# Patient Record
Sex: Female | Born: 1960 | Race: Black or African American | Hispanic: No | Marital: Single | Smoking: Former smoker
Health system: Southern US, Community
[De-identification: ages and names within clinical notes are randomized; demographics above are authoritative.]

## PROBLEM LIST (undated history)

## (undated) DIAGNOSIS — M1990 Unspecified osteoarthritis, unspecified site: Secondary | ICD-10-CM

## (undated) DIAGNOSIS — Z9289 Personal history of other medical treatment: Secondary | ICD-10-CM

## (undated) DIAGNOSIS — F319 Bipolar disorder, unspecified: Secondary | ICD-10-CM

## (undated) DIAGNOSIS — D649 Anemia, unspecified: Secondary | ICD-10-CM

## (undated) DIAGNOSIS — J449 Chronic obstructive pulmonary disease, unspecified: Secondary | ICD-10-CM

## (undated) DIAGNOSIS — K219 Gastro-esophageal reflux disease without esophagitis: Secondary | ICD-10-CM

## (undated) DIAGNOSIS — I10 Essential (primary) hypertension: Secondary | ICD-10-CM

## (undated) DIAGNOSIS — G8929 Other chronic pain: Secondary | ICD-10-CM

## (undated) DIAGNOSIS — Z9989 Dependence on other enabling machines and devices: Secondary | ICD-10-CM

## (undated) DIAGNOSIS — F329 Major depressive disorder, single episode, unspecified: Secondary | ICD-10-CM

## (undated) DIAGNOSIS — G43909 Migraine, unspecified, not intractable, without status migrainosus: Secondary | ICD-10-CM

## (undated) DIAGNOSIS — M545 Low back pain, unspecified: Secondary | ICD-10-CM

## (undated) DIAGNOSIS — E669 Obesity, unspecified: Secondary | ICD-10-CM

## (undated) DIAGNOSIS — F32A Depression, unspecified: Secondary | ICD-10-CM

## (undated) DIAGNOSIS — Z8711 Personal history of peptic ulcer disease: Secondary | ICD-10-CM

## (undated) DIAGNOSIS — J189 Pneumonia, unspecified organism: Secondary | ICD-10-CM

## (undated) DIAGNOSIS — M25559 Pain in unspecified hip: Secondary | ICD-10-CM

## (undated) DIAGNOSIS — R51 Headache: Secondary | ICD-10-CM

## (undated) DIAGNOSIS — I2699 Other pulmonary embolism without acute cor pulmonale: Secondary | ICD-10-CM

## (undated) DIAGNOSIS — G4733 Obstructive sleep apnea (adult) (pediatric): Secondary | ICD-10-CM

## (undated) DIAGNOSIS — J45909 Unspecified asthma, uncomplicated: Secondary | ICD-10-CM

## (undated) DIAGNOSIS — Z8719 Personal history of other diseases of the digestive system: Secondary | ICD-10-CM

## (undated) DIAGNOSIS — E119 Type 2 diabetes mellitus without complications: Secondary | ICD-10-CM

## (undated) HISTORY — PX: CARPAL TUNNEL RELEASE: SHX101

## (undated) HISTORY — PX: UMBILICAL HERNIA REPAIR: SHX196

## (undated) HISTORY — PX: HERNIA REPAIR: SHX51

## (undated) HISTORY — PX: ABDOMINAL HERNIA REPAIR: SHX539

---
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
## 1984-10-23 HISTORY — PX: DILATION AND CURETTAGE OF UTERUS: SHX78

## 1986-10-23 HISTORY — PX: TUBAL LIGATION: SHX77

## 1991-10-24 HISTORY — PX: TONSILLECTOMY: SUR1361

## 1998-10-27 ENCOUNTER — Emergency Department (HOSPITAL_COMMUNITY): Admission: EM | Admit: 1998-10-27 | Discharge: 1998-10-28 | Payer: Self-pay

## 1999-05-03 ENCOUNTER — Emergency Department (HOSPITAL_COMMUNITY): Admission: EM | Admit: 1999-05-03 | Discharge: 1999-05-03 | Payer: Self-pay | Admitting: Emergency Medicine

## 1999-05-03 ENCOUNTER — Encounter: Payer: Self-pay | Admitting: Emergency Medicine

## 1999-05-06 ENCOUNTER — Emergency Department (HOSPITAL_COMMUNITY): Admission: EM | Admit: 1999-05-06 | Discharge: 1999-05-06 | Payer: Self-pay | Admitting: Emergency Medicine

## 1999-06-23 ENCOUNTER — Emergency Department (HOSPITAL_COMMUNITY): Admission: EM | Admit: 1999-06-23 | Discharge: 1999-06-23 | Payer: Self-pay | Admitting: Emergency Medicine

## 2002-06-29 ENCOUNTER — Emergency Department (HOSPITAL_COMMUNITY): Admission: EM | Admit: 2002-06-29 | Discharge: 2002-06-29 | Payer: Self-pay | Admitting: Emergency Medicine

## 2002-06-29 ENCOUNTER — Encounter: Payer: Self-pay | Admitting: Emergency Medicine

## 2003-10-24 DIAGNOSIS — Z9289 Personal history of other medical treatment: Secondary | ICD-10-CM

## 2003-10-24 HISTORY — PX: UTERINE FIBROID EMBOLIZATION: SHX825

## 2003-10-24 HISTORY — DX: Personal history of other medical treatment: Z92.89

## 2009-10-23 HISTORY — PX: REDUCTION MAMMAPLASTY: SUR839

## 2013-01-14 ENCOUNTER — Encounter (HOSPITAL_COMMUNITY): Payer: Self-pay | Admitting: Emergency Medicine

## 2013-01-14 ENCOUNTER — Emergency Department (HOSPITAL_COMMUNITY)
Admission: EM | Admit: 2013-01-14 | Discharge: 2013-01-14 | Disposition: A | Discharge status: Home / Self Care | Payer: Medicare Other | Attending: Emergency Medicine | Admitting: Emergency Medicine

## 2013-01-14 DIAGNOSIS — J45909 Unspecified asthma, uncomplicated: Secondary | ICD-10-CM | POA: Insufficient documentation

## 2013-01-14 DIAGNOSIS — K029 Dental caries, unspecified: Secondary | ICD-10-CM

## 2013-01-14 DIAGNOSIS — I10 Essential (primary) hypertension: Secondary | ICD-10-CM | POA: Insufficient documentation

## 2013-01-14 DIAGNOSIS — E119 Type 2 diabetes mellitus without complications: Secondary | ICD-10-CM | POA: Insufficient documentation

## 2013-01-14 DIAGNOSIS — Z79899 Other long term (current) drug therapy: Secondary | ICD-10-CM | POA: Insufficient documentation

## 2013-01-14 DIAGNOSIS — E669 Obesity, unspecified: Secondary | ICD-10-CM | POA: Insufficient documentation

## 2013-01-14 HISTORY — DX: Unspecified asthma, uncomplicated: J45.909

## 2013-01-14 HISTORY — DX: Obesity, unspecified: E66.9

## 2013-01-14 HISTORY — DX: Essential (primary) hypertension: I10

## 2013-01-14 MED ORDER — HYDROCODONE-ACETAMINOPHEN 5-325 MG PO TABS
1.0000 | ORAL_TABLET | Freq: Once | ORAL | Status: DC
  Filled 2013-01-14: qty 1

## 2013-01-14 MED ORDER — HYDROCODONE-ACETAMINOPHEN 5-325 MG PO TABS: 2.0000 | ORAL_TABLET | Freq: Four times a day (QID) | ORAL | Status: DC | PRN

## 2013-01-14 MED ORDER — OXYCODONE-ACETAMINOPHEN 5-325 MG PO TABS
1.0000 | ORAL_TABLET | Freq: Once | ORAL | Status: AC
  Administered 2013-01-14: 1 via ORAL
  Filled 2013-01-14: qty 1

## 2013-01-14 MED ORDER — OXYCODONE-ACETAMINOPHEN 5-325 MG PO TABS: 1.0000 | ORAL_TABLET | Freq: Four times a day (QID) | ORAL | Status: DC | PRN

## 2013-01-14 NOTE — ED Notes (Signed)
PT. REPORTS RIGHT UPPER MOLAR PAIN ONSET YESTERDAY UNRELIEVED BY OTC PAIN MEDICATIONS.

## 2013-01-14 NOTE — ED Provider Notes (Signed)
Medical screening examination/treatment/procedure(s) were performed by non-physician practitioner and as supervising physician I was immediately available for consultation/collaboration.   Edinson Domeier L Mekiah Wahler, MD 01/14/13 0305 

## 2013-01-14 NOTE — ED Provider Notes (Signed)
History     CSN: [id number]  Arrival date & time 01/14/13  0010   First MD Initiated Contact with Patient 01/14/13 0100      Chief Complaint  Patient presents with  . Dental Pain    (Consider location/radiation/quality/duration/timing/severity/associated sxs/prior treatment) HPI Comments: Cavity in upper first molar for several days has ben unable to but any OTC meds due to finances   Patient is a 52 y.o. female presenting with tooth pain. The history is provided by the patient. The history is limited by a language barrier.  Dental PainThe primary symptoms include mouth pain. Primary symptoms do not include dental injury, headaches or fever. The symptoms began 2 days ago. The symptoms are worsening. The symptoms occur constantly.  Additional symptoms do not include: gum swelling, gum tenderness, trouble swallowing and ear pain.    Past Medical History  Diagnosis Date  . Obesity   . Diabetes mellitus without complication   . Hypertension   . Asthma     Past Surgical History  Procedure Laterality Date  . Breast reduction surgery    . Hernia repair    . Tubal ligation    . Ankle arthroscopy      No family history on file.  History  Substance Use Topics  . Smoking status: Never Smoker   . Smokeless tobacco: Not on file  . Alcohol Use: No    OB History   Grav Para Term Preterm Abortions TAB SAB Ect Mult Living                  Review of Systems  Constitutional: Negative for fever and chills.  HENT: Positive for dental problem. Negative for ear pain and trouble swallowing.   Neurological: Negative for headaches.  All other systems reviewed and are negative.    Allergies  Coconut oil and Mushroom extract complex  Home Medications   Current Outpatient Rx  Name  Route  Sig  Dispense  Refill  . gabapentin (NEURONTIN) 300 MG capsule   Oral   Take 300 mg by mouth 3 (three) times daily.         . hydrochlorothiazide (HYDRODIURIL) 25 MG tablet   Oral  Take 25 mg by mouth daily.         . meloxicam (MOBIC) 15 MG tablet   Oral   Take 15 mg by mouth daily.         Marland Kitchen tolterodine (DETROL LA) 4 MG 24 hr capsule   Oral   Take 4 mg by mouth daily.         Marland Kitchen HYDROcodone-acetaminophen (NORCO/VICODIN) 5-325 MG per tablet   Oral   Take 2 tablets by mouth every 6 (six) hours as needed for pain.   30 tablet   0   . oxyCODONE-acetaminophen (PERCOCET/ROXICET) 5-325 MG per tablet   Oral   Take 1 tablet by mouth every 6 (six) hours as needed for pain.   9 tablet   0     BP 148/84  Pulse 88  Temp(Src) 97.3 F (36.3 C) (Oral)  Resp 16  SpO2 95%  Physical Exam  Constitutional: She appears well-nourished.  HENT:  Head: Normocephalic.  Mouth/Throat:    Eyes: Pupils are equal, round, and reactive to light.  Neck: Normal range of motion.  Cardiovascular: Normal rate.   Pulmonary/Chest: Effort normal.  Musculoskeletal: Normal range of motion.  Neurological: She is alert.  Skin: Skin is warm and dry.    ED Course  Dental Date/Time:  01/14/2013 2:14 AM Performed by: Arman Filter Authorized by: Arman Filter Consent: Verbal consent obtained. Risks and benefits: risks, benefits and alternatives were discussed Consent given by: patient Patient identity confirmed: verbally with patient Time out: Immediately prior to procedure a "time out" was called to verify the correct patient, procedure, equipment, support staff and site/side marked as required. Local anesthesia used: yes Anesthesia: nerve block Local anesthetic: bupivacaine 0.5% without epinephrine Anesthetic total: 1.5 ml Patient sedated: no Patient tolerance: Patient tolerated the procedure well with no immediate complications. Comments: Good coverage and pain relief   (including critical care time)  Labs Reviewed - No data to display No results found.   1. Dental cavity       MDM  Dental block dental referral an Percocet Rx         Arman Filter,  NP 01/14/13 0216  Arman Filter, NP 01/14/13 4098

## 2013-05-28 ENCOUNTER — Other Ambulatory Visit: Payer: Self-pay | Admitting: Obstetrics and Gynecology

## 2013-05-28 DIAGNOSIS — Z1231 Encounter for screening mammogram for malignant neoplasm of breast: Secondary | ICD-10-CM

## 2013-06-13 ENCOUNTER — Ambulatory Visit: Payer: Medicare Other

## 2013-06-24 ENCOUNTER — Ambulatory Visit
Admission: RE | Admit: 2013-06-24 | Discharge: 2013-06-24 | Disposition: A | Discharge status: Home / Self Care | Payer: Medicare Other | Source: Ambulatory Visit | Attending: Obstetrics and Gynecology | Admitting: Obstetrics and Gynecology

## 2013-06-24 DIAGNOSIS — Z1231 Encounter for screening mammogram for malignant neoplasm of breast: Secondary | ICD-10-CM

## 2013-06-25 ENCOUNTER — Other Ambulatory Visit: Payer: Self-pay | Admitting: Obstetrics and Gynecology

## 2013-06-25 DIAGNOSIS — N644 Mastodynia: Secondary | ICD-10-CM

## 2013-06-25 DIAGNOSIS — N63 Unspecified lump in unspecified breast: Secondary | ICD-10-CM

## 2013-08-22 ENCOUNTER — Emergency Department (HOSPITAL_COMMUNITY): Payer: Medicare Other

## 2013-08-22 ENCOUNTER — Emergency Department (HOSPITAL_COMMUNITY)
Admission: EM | Admit: 2013-08-22 | Discharge: 2013-08-22 | Disposition: A | Discharge status: Home / Self Care | Payer: Medicare Other | Attending: Emergency Medicine | Admitting: Emergency Medicine

## 2013-08-22 ENCOUNTER — Encounter (HOSPITAL_COMMUNITY): Payer: Self-pay | Admitting: Emergency Medicine

## 2013-08-22 DIAGNOSIS — M7989 Other specified soft tissue disorders: Secondary | ICD-10-CM | POA: Insufficient documentation

## 2013-08-22 DIAGNOSIS — I10 Essential (primary) hypertension: Secondary | ICD-10-CM | POA: Insufficient documentation

## 2013-08-22 DIAGNOSIS — J329 Chronic sinusitis, unspecified: Secondary | ICD-10-CM | POA: Insufficient documentation

## 2013-08-22 DIAGNOSIS — Z794 Long term (current) use of insulin: Secondary | ICD-10-CM | POA: Insufficient documentation

## 2013-08-22 DIAGNOSIS — E119 Type 2 diabetes mellitus without complications: Secondary | ICD-10-CM | POA: Insufficient documentation

## 2013-08-22 DIAGNOSIS — Z791 Long term (current) use of non-steroidal anti-inflammatories (NSAID): Secondary | ICD-10-CM | POA: Insufficient documentation

## 2013-08-22 DIAGNOSIS — R609 Edema, unspecified: Secondary | ICD-10-CM | POA: Insufficient documentation

## 2013-08-22 DIAGNOSIS — E669 Obesity, unspecified: Secondary | ICD-10-CM | POA: Insufficient documentation

## 2013-08-22 DIAGNOSIS — J45901 Unspecified asthma with (acute) exacerbation: Secondary | ICD-10-CM | POA: Insufficient documentation

## 2013-08-22 DIAGNOSIS — M79609 Pain in unspecified limb: Secondary | ICD-10-CM | POA: Insufficient documentation

## 2013-08-22 DIAGNOSIS — IMO0002 Reserved for concepts with insufficient information to code with codable children: Secondary | ICD-10-CM | POA: Insufficient documentation

## 2013-08-22 DIAGNOSIS — Z79899 Other long term (current) drug therapy: Secondary | ICD-10-CM | POA: Insufficient documentation

## 2013-08-22 DIAGNOSIS — Z792 Long term (current) use of antibiotics: Secondary | ICD-10-CM | POA: Insufficient documentation

## 2013-08-22 DIAGNOSIS — H9319 Tinnitus, unspecified ear: Secondary | ICD-10-CM | POA: Insufficient documentation

## 2013-08-22 LAB — BASIC METABOLIC PANEL
CO2: 26 mEq/L (ref 19–32)
Chloride: 103 mEq/L (ref 96–112)
Sodium: 140 mEq/L (ref 135–145)

## 2013-08-22 LAB — POCT I-STAT TROPONIN I: Troponin i, poc: 0 ng/mL (ref 0.00–0.08)

## 2013-08-22 LAB — CBC
Platelets: 241 10*3/uL (ref 150–400)
RBC: 4.41 MIL/uL (ref 3.87–5.11)
WBC: 9.4 10*3/uL (ref 4.0–10.5)

## 2013-08-22 LAB — PRO B NATRIURETIC PEPTIDE: Pro B Natriuretic peptide (BNP): 13.3 pg/mL (ref 0–125)

## 2013-08-22 IMAGING — CR DG CHEST 2V
2 series · 2 of 2 positions shown · non-contrast
Comparison: None.

CLINICAL DATA: Shortness of breath

EXAM:
CHEST  2 VIEW

[w chest pa]
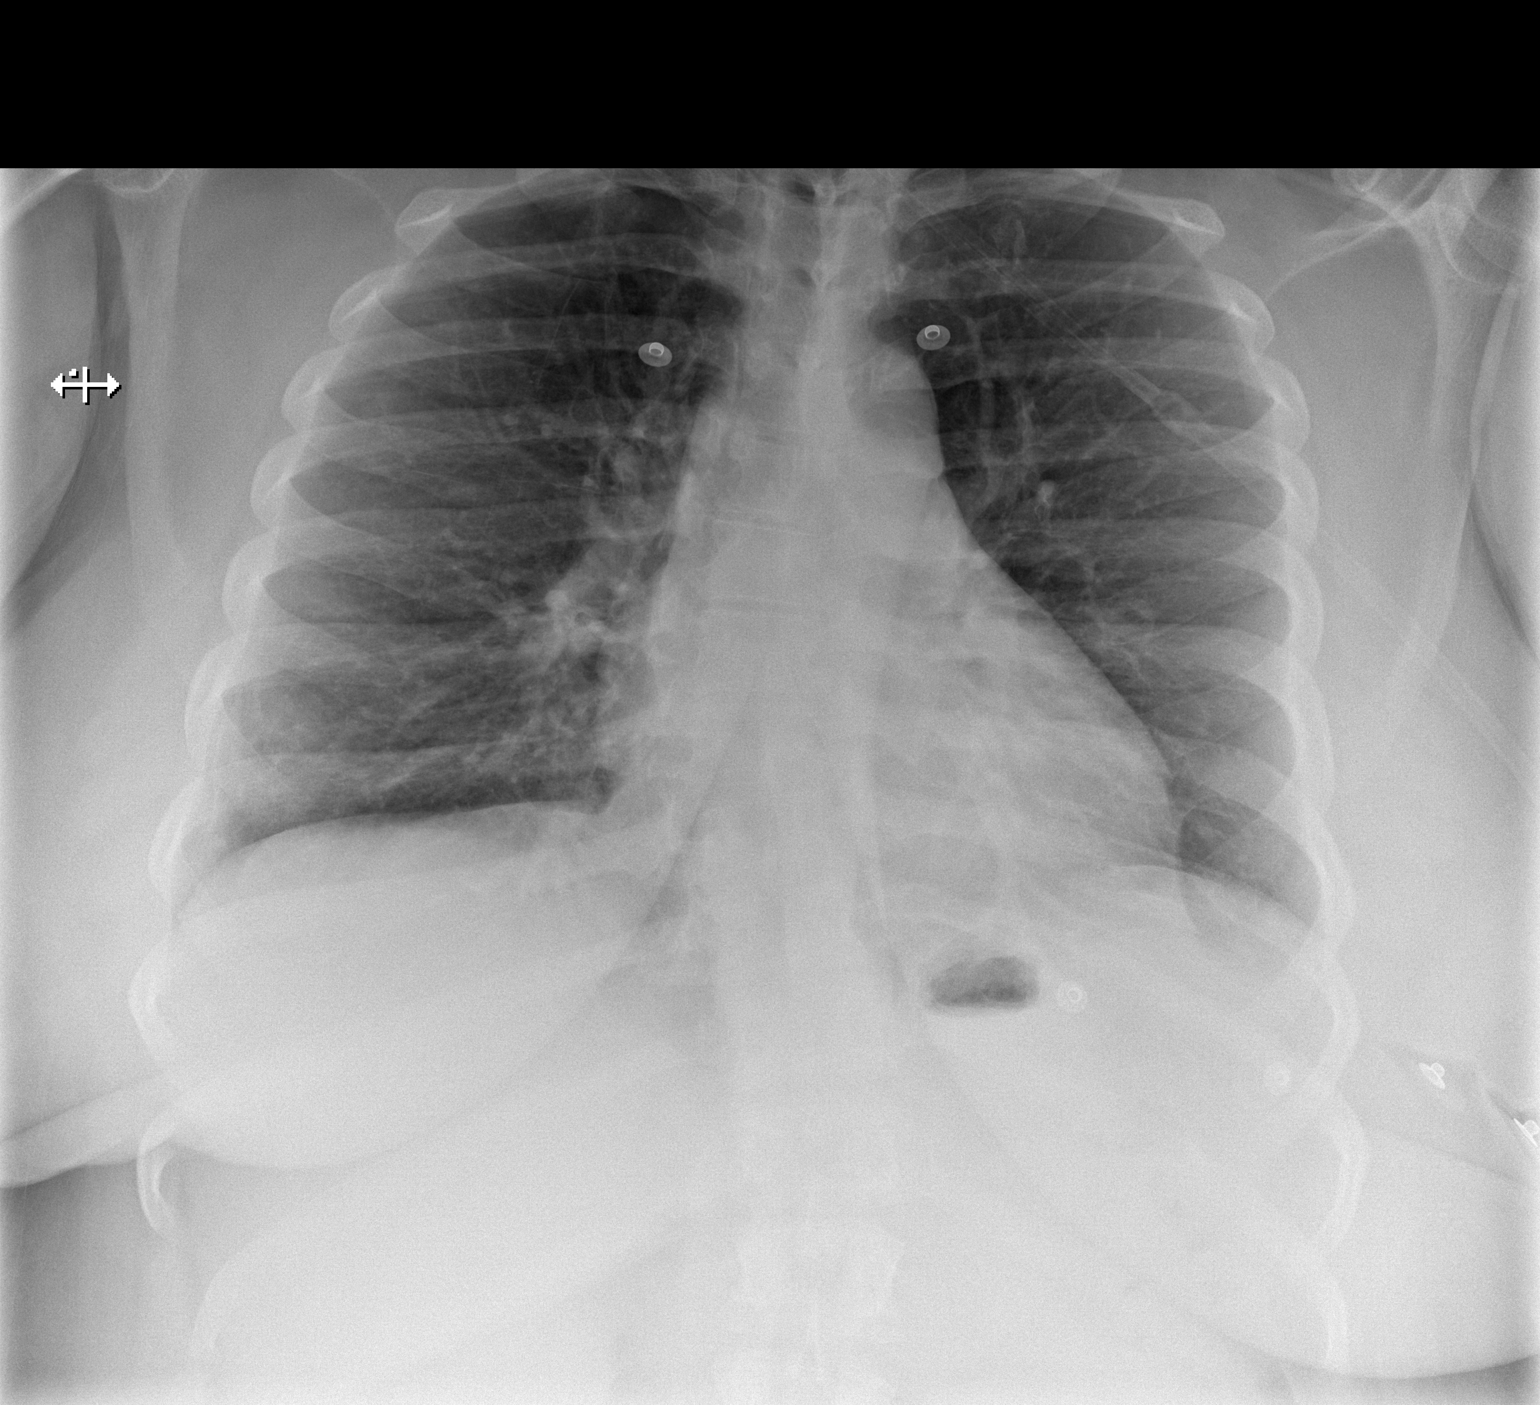

[w chest lat]
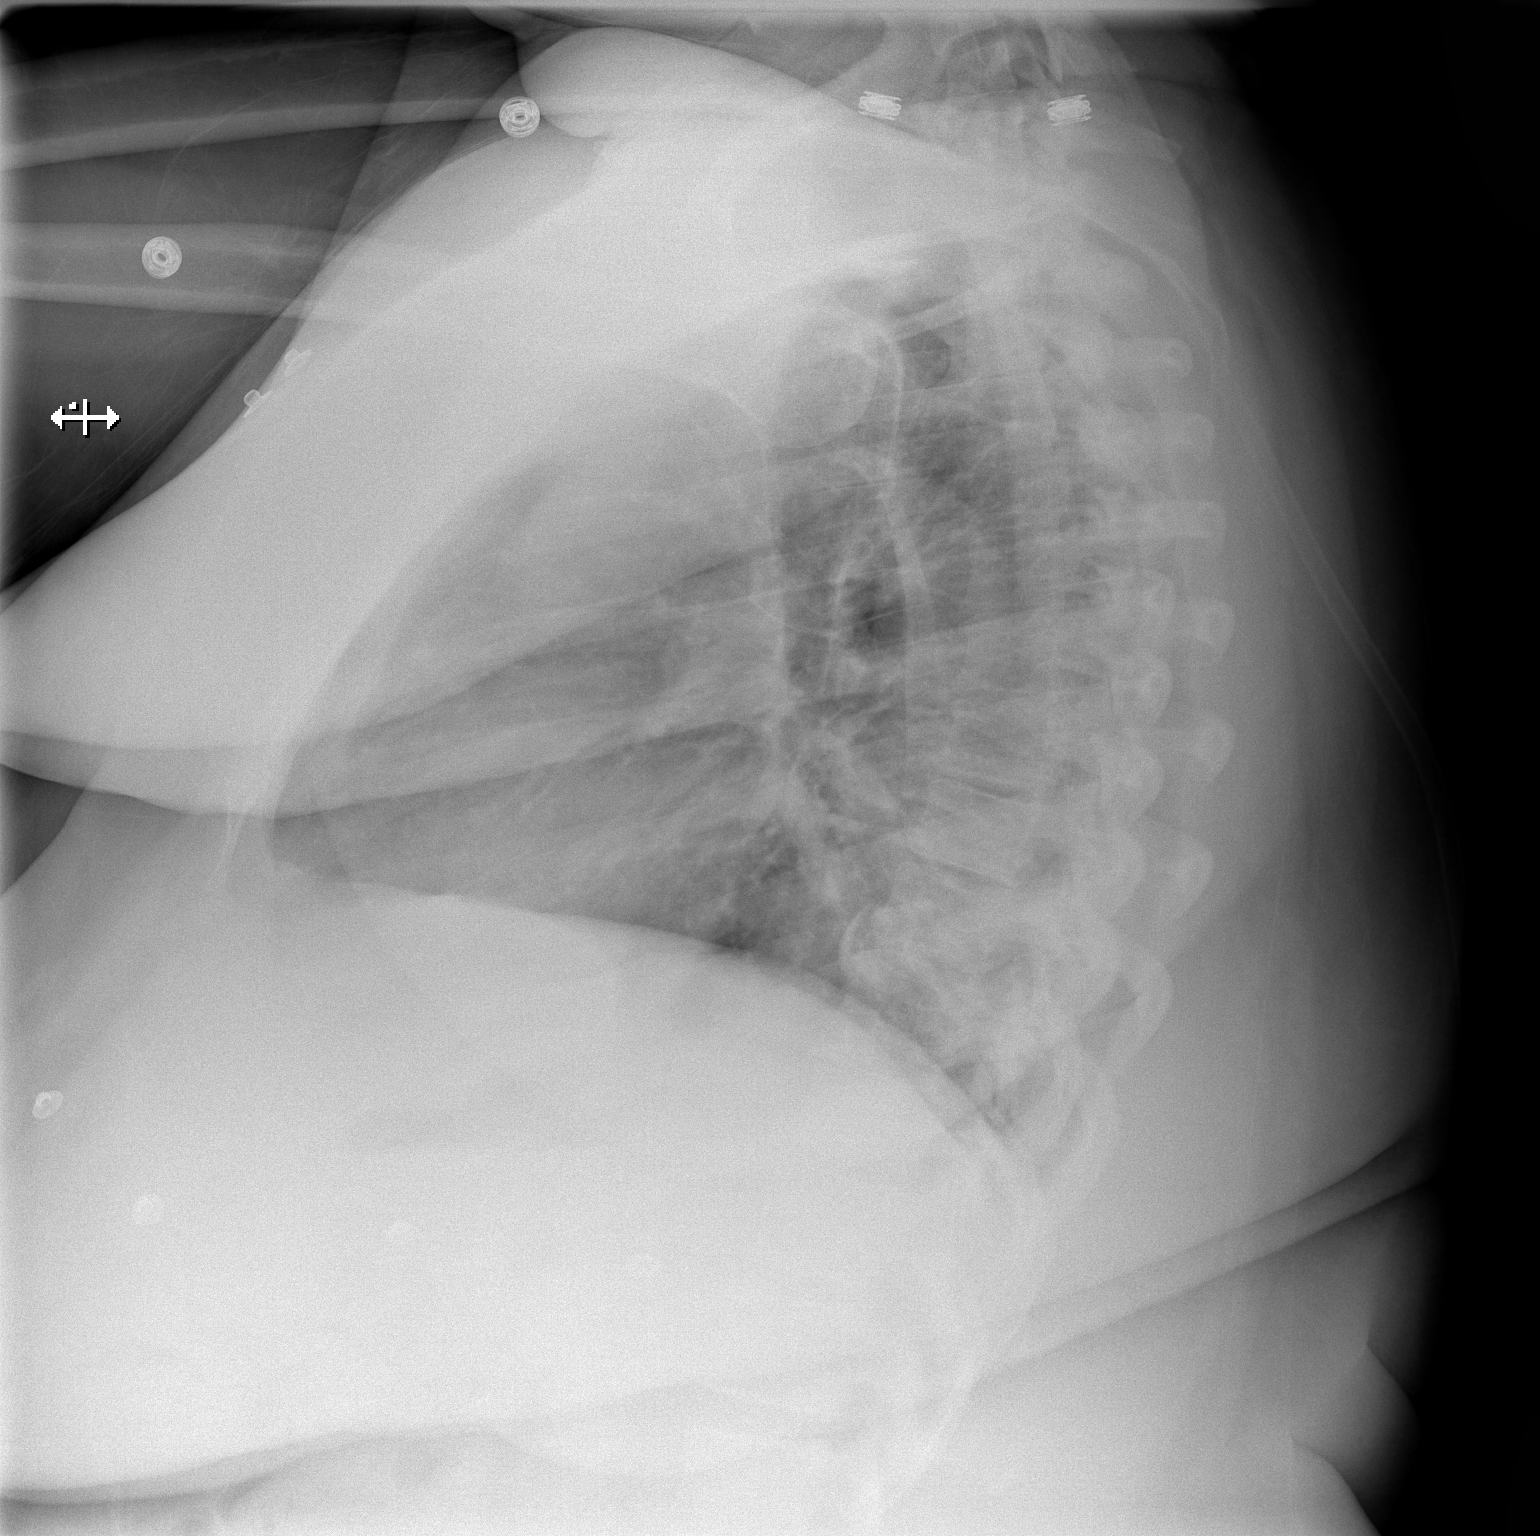

[2 of 2 positions shown; findings below may reference images not displayed]

FINDINGS: The heart size and mediastinal contours are within normal limits.
Both lungs are clear. The visualized skeletal structures are
unremarkable. Right lower lobe scarring is incidentally noted.
IMPRESSION: No active cardiopulmonary disease.

## 2013-08-22 MED ORDER — IPRATROPIUM-ALBUTEROL 18-103 MCG/ACT IN AERO: 2.0000 | INHALATION_SPRAY | Freq: Four times a day (QID) | RESPIRATORY_TRACT | Status: DC | PRN

## 2013-08-22 MED ORDER — METOCLOPRAMIDE HCL 5 MG/ML IJ SOLN
10.0000 mg | Freq: Once | INTRAMUSCULAR | Status: AC
  Administered 2013-08-22: 10 mg via INTRAVENOUS
  Filled 2013-08-22: qty 2

## 2013-08-22 MED ORDER — OXYCODONE-ACETAMINOPHEN 5-325 MG PO TABS: 2.0000 | ORAL_TABLET | ORAL | Status: DC | PRN

## 2013-08-22 MED ORDER — HYDROCHLOROTHIAZIDE 25 MG PO TABS: 25.0000 mg | ORAL_TABLET | Freq: Every day | ORAL | Status: DC

## 2013-08-22 MED ORDER — ALBUTEROL SULFATE (5 MG/ML) 0.5% IN NEBU
2.5000 mg | INHALATION_SOLUTION | RESPIRATORY_TRACT | Status: DC | PRN
  Administered 2013-08-22: 2.5 mg via RESPIRATORY_TRACT
  Filled 2013-08-22: qty 0.5

## 2013-08-22 MED ORDER — FLUTICASONE PROPIONATE 50 MCG/ACT NA SUSP: NASAL | Status: DC

## 2013-08-22 MED ORDER — AZITHROMYCIN 250 MG PO TABS: ORAL_TABLET | ORAL | Status: DC

## 2013-08-22 MED ORDER — LORAZEPAM 2 MG/ML IJ SOLN
1.0000 mg | INTRAMUSCULAR | Status: DC | PRN
  Administered 2013-08-22: 1 mg via INTRAVENOUS
  Filled 2013-08-22: qty 1

## 2013-08-22 MED ORDER — DEXAMETHASONE SODIUM PHOSPHATE 10 MG/ML IJ SOLN
10.0000 mg | Freq: Once | INTRAMUSCULAR | Status: AC
  Administered 2013-08-22: 10 mg via INTRAVENOUS
  Filled 2013-08-22: qty 1

## 2013-08-22 MED ORDER — MORPHINE SULFATE 4 MG/ML IJ SOLN: 4.0000 mg | INTRAMUSCULAR | Status: DC | PRN

## 2013-08-22 NOTE — ED Notes (Signed)
Pt states she has panic attacks when she has CT's.  Dr Fayrene Fearing notified.

## 2013-08-22 NOTE — ED Provider Notes (Signed)
CSN: [id number]     Arrival date & time 08/22/13  1256 History   First MD Initiated Contact with Patient 08/22/13 1305     Chief Complaint  Patient presents with  . Headache    HPI  Patient is here with her mom. She has several complaints and states she just feels bad in general. She's had a headache that is bifrontal right greater than left the last 3-4 days. Mild nausea. Her sugars have been okay. She is using her mom's inhalers and she is ran out of her own. She does admit to she's not using them as often as been more short of breath. She has intermittent lower extremity edema that has been more prevalent than last week. She is a heavy symmetric pain or swelling to her extremities. She's not been febrile. No falls injuries or trauma to her head. No neurological symptoms of weakness, aphasias,  confusion.  Past Medical History  Diagnosis Date  . Obesity   . Diabetes mellitus without complication   . Hypertension   . Asthma    Past Surgical History  Procedure Laterality Date  . Breast reduction surgery    . Hernia repair    . Tubal ligation    . Ankle arthroscopy     History reviewed. No pertinent family history. History  Substance Use Topics  . Smoking status: Never Smoker   . Smokeless tobacco: Not on file  . Alcohol Use: No   OB History   Grav Para Term Preterm Abortions TAB SAB Ect Mult Living                 Review of Systems  Constitutional: Negative for fever, chills, diaphoresis, appetite change and fatigue.  HENT: Negative for mouth sores, sore throat and trouble swallowing.   Eyes: Negative for visual disturbance.  Respiratory: Positive for cough and shortness of breath. Negative for chest tightness and wheezing.   Cardiovascular: Positive for chest pain.  Gastrointestinal: Positive for nausea. Negative for vomiting, abdominal pain, diarrhea and abdominal distention.  Endocrine: Negative for polydipsia, polyphagia and polyuria.  Genitourinary: Negative for  dysuria, frequency and hematuria.  Musculoskeletal: Negative for gait problem.  Skin: Negative for color change, pallor and rash.  Neurological: Positive for headaches. Negative for dizziness, syncope and light-headedness.  Hematological: Does not bruise/bleed easily.  Psychiatric/Behavioral: Negative for behavioral problems and confusion.    Allergies  Ceftin; Celebrex; Coconut oil; Hydrocodone; and Mushroom extract complex  Home Medications   Current Outpatient Rx  Name  Route  Sig  Dispense  Refill  . albuterol (PROVENTIL HFA;VENTOLIN HFA) 108 (90 BASE) MCG/ACT inhaler   Inhalation   Inhale 2 puffs into the lungs every 6 (six) hours as needed for wheezing or shortness of breath.         . buprenorphine (BUTRANS) 10 MCG/HR PTWK patch   Transdermal   Place 10 mcg onto the skin once a week. On Mondays         . gabapentin (NEURONTIN) 600 MG tablet   Oral   Take 600 mg by mouth 3 (three) times daily. 1/2 tablet (300 mg) every morning and afternoon, 1 tablet (600 mg) at bedtime         . insulin aspart (NOVOLOG FLEXPEN) 100 UNIT/ML SOPN FlexPen   Subcutaneous   Inject 10-20 Units into the skin 3 (three) times daily with meals. Per sliding scale         . Insulin Glargine (LANTUS SOLOSTAR) 100 UNIT/ML SOPN  Subcutaneous   Inject 85 Units into the skin at bedtime.         Marland Kitchen lisinopril-hydrochlorothiazide (PRINZIDE,ZESTORETIC) 10-12.5 MG per tablet   Oral   Take 1 tablet by mouth daily.         . meloxicam (MOBIC) 15 MG tablet   Oral   Take 15 mg by mouth daily.         Marland Kitchen tolterodine (DETROL LA) 4 MG 24 hr capsule   Oral   Take 4 mg by mouth daily.         . valACYclovir (VALTREX) 500 MG tablet   Oral   Take 500 mg by mouth 2 (two) times daily.         Marland Kitchen albuterol-ipratropium (COMBIVENT) 18-103 MCG/ACT inhaler   Inhalation   Inhale 2 puffs into the lungs every 6 (six) hours as needed for wheezing.   1 Inhaler   1   . azithromycin (ZITHROMAX  Z-PAK) 250 MG tablet      X 5 days as directed   6 each   0   . fluticasone (FLONASE) 50 MCG/ACT nasal spray      1 spray q nares bid   10 g   1   . hydrochlorothiazide (HYDRODIURIL) 25 MG tablet   Oral   Take 1 tablet (25 mg total) by mouth daily.   30 tablet   0   . oxyCODONE-acetaminophen (PERCOCET/ROXICET) 5-325 MG per tablet   Oral   Take 2 tablets by mouth every 4 (four) hours as needed for pain.   6 tablet   0    BP 124/77  Pulse 83  Temp(Src) 98.4 F (36.9 C) (Oral)  Resp 22  SpO2 92% Physical Exam  Constitutional:  Obese female. She should rash to light. She's awake alert. She is able to participate in her history and exam,  HENT:  Tinnitus in the right frontal forehead right mid face. Nares are congested. The pharynx is benign.  Eyes:  Conjunctiva normal. Not pale.  Neck:  Next is large broad thick. No JVD.  Cardiovascular: Normal rate, regular rhythm, S1 normal and S2 normal.   Pulmonary/Chest:  Clear lungs. No crackles. No diminished basilar breath sounds  Abdominal: There is no tenderness. There is no rigidity and no guarding.  Musculoskeletal:  No weakness  Neurological:  Awake alert. Normal cranial nerves. Normal peripheral neurological exam. Gait not tested.  Skin:  Trace symmetric bilateral extremity edema. No cording swelling erythema or pain  Psychiatric: She has a normal mood and affect. Her speech is normal and behavior is normal. Thought content normal.    ED Course  Procedures (including critical care time) Labs Review Labs Reviewed  CBC - Abnormal; Notable for the following:    RDW 15.6 (*)    All other components within normal limits  BASIC METABOLIC PANEL - Abnormal; Notable for the following:    Glucose, Bld 227 (*)    All other components within normal limits  PRO B NATRIURETIC PEPTIDE  POCT I-STAT TROPONIN I   Imaging Review Dg Chest 2 View  08/22/2013   CLINICAL DATA:  Shortness of breath  EXAM: CHEST  2 VIEW   COMPARISON:  None.  FINDINGS: The heart size and mediastinal contours are within normal limits. Both lungs are clear. The visualized skeletal structures are unremarkable. Right lower lobe scarring is incidentally noted.  IMPRESSION: No active cardiopulmonary disease.   Electronically Signed   By: Christiana Pellant M.D.   On: 08/22/2013  14:23   Ct Head Wo Contrast  08/22/2013   CLINICAL DATA:  Headache. Vertigo. Hypertension. Diabetes.  EXAM: CT HEAD WITHOUT CONTRAST  TECHNIQUE: Contiguous axial images were obtained from the base of the skull through the vertex without intravenous contrast.  COMPARISON:  07/10/2004  FINDINGS: No intracranial hemorrhage.  No CT evidence of large acute infarct. If small posterior fossa infarct is of clinical concern, MR imaging may be considered.  Partially and the expanded sella unchanged from prior exam.  Bony projection posterior aspect the clivus unchanged.  No intracranial mass lesion noted on this unenhanced exam.  Partial opacification right maxillary sinus with small air fluid level.  Mild exophthalmos.  IMPRESSION: No intracranial hemorrhage or CT evidence of large acute infarct.  Partial opacification right maxillary sinus.  Please see above.   Electronically Signed   By: Bridgett Larsson M.D.   On: 08/22/2013 15:40    EKG Interpretation     Ventricular Rate:  87 PR Interval:  152 QRS Duration: 102 QT Interval:  402 QTC Calculation: 484 R Axis:   43 Text Interpretation:  Sinus rhythm Early repol pattern No injury No Infarct            MDM   1. Headache   2. Sinusitis    Chest x-ray shows no acute process. EKG shows no changes. Normal troponin. His maxillary sinusitis but otherwise normal CT of her head. This A right-sided headache. Headache or edema simply dependent. Platelet hydrochlorothiazide. Will resume medications of her own to take for her COPD. She has a refrain from smoking. Treatment for sinusitis.    Roney Marion, MD 08/22/13 2009

## 2013-08-22 NOTE — ED Notes (Addendum)
Pt called EMS for headache that started yesterday morning; left side, sensitive to light. Hx of migraines but reports this one is different. Associated symptoms are dizziness and nausea. Chest pain starting last night while at rest; left sided and radiating to left arm described as cramping. Associated symptoms with chest pain are fatigue and SOB. CBG 277. 1 nitro, aspirin, zofran given. Pain relieved but has returned upon arrival.

## 2013-08-22 NOTE — ED Notes (Signed)
Pt back from x-ray.

## 2013-09-19 ENCOUNTER — Other Ambulatory Visit: Payer: Medicare Other

## 2014-02-16 ENCOUNTER — Encounter: Payer: Self-pay | Admitting: Podiatry

## 2014-02-16 ENCOUNTER — Ambulatory Visit (INDEPENDENT_AMBULATORY_CARE_PROVIDER_SITE_OTHER): Payer: Medicare Other

## 2014-02-16 ENCOUNTER — Ambulatory Visit (INDEPENDENT_AMBULATORY_CARE_PROVIDER_SITE_OTHER): Payer: Medicare Other | Admitting: Podiatry

## 2014-02-16 VITALS — BP 156/96 | HR 92 | Resp 19 | Ht 69.0 in | Wt 378.0 lb

## 2014-02-16 DIAGNOSIS — R52 Pain, unspecified: Secondary | ICD-10-CM

## 2014-02-16 DIAGNOSIS — S93409A Sprain of unspecified ligament of unspecified ankle, initial encounter: Secondary | ICD-10-CM

## 2014-02-16 DIAGNOSIS — S93609A Unspecified sprain of unspecified foot, initial encounter: Secondary | ICD-10-CM

## 2014-02-16 NOTE — Patient Instructions (Signed)
Continue on weight reduction. For gym workout consider water aerobics is much is possible. Change or athletic style shoes out whenever the heel counters begin to bend.

## 2014-02-16 NOTE — Progress Notes (Signed)
   Subjective:    Patient ID: Kirsten Gonzalez, female    DOB: Sep 04, 1961, 53 y.o.   MRN: [id number]  HPI Comments: N foot pain L left entire foot D greater than 1 year O 2013 slipped getting up from a fall C throbbing while seated, sharp pain while walking, occasional burning A no particular stimuli T purchased ankle braces  Right ankle - history of fracture at 53 years old, continues to be painful on and off.    Foot Pain Associated symptoms include arthralgias, headaches, myalgias, numbness and weakness.      Review of Systems  HENT: Positive for tinnitus.   Respiratory: Positive for shortness of breath and wheezing.   Cardiovascular: Positive for leg swelling.  Endocrine: Positive for heat intolerance.  Musculoskeletal: Positive for arthralgias, back pain, gait problem and myalgias.       Left thumb pain and wears a brace  Neurological: Positive for dizziness, weakness, numbness and headaches.  Psychiatric/Behavioral:       Bipolar disorder  All other systems reviewed and are negative.      Objective:   Physical Exam  Orientated x3 female  Vascular: DP and PT pulses 2/4 bilaterally  Neurological: Sensation to 10 g monofilament wire intact 4/5 bilaterally. Vibratory sensation intact bilaterally. Ankle reflexes nonreactive bilaterally  Dermatological: The lower extremities are very fleshy bilaterally. no skin lesions are noted bilaterally  Musculoskeletal: Advanced HAV deformities noted bilaterally. Pes planus noted bilaterally Palpable tenderness medial left ankle lateral left ankle and medial right ankle.  There is no restriction ankle, subtalar joint, midtarsal joints bilaterally  X-ray report right ankle  Intact bony structure without fracture or dislocation noted. Large fleshy lower extremity noted. The ankle mortise is intact. Pes planus noted. Inferior calcaneal spur noted.  Radiographic impression: No acute bony abnormality noted  X-ray examination  left ankle  Intact bony structure without fracture or dislocation noted. Large fleshy lower extremity noted. The ankle mortise is intact. Pes planus noted.  Radiographic impression: no acute bony abnormality noted  X-ray examination right foot  Intact bony structure without fracture or dislocation noted. Large fleshy lower extremity noted. Inferior calcaneal spur noted. Advanced HAV deformity noted. Pes planus.  Radiographic impression: No acute bony abnormality noted right foot.   X-ray examination left foot  Intact bony structure without fracture or dislocation noted. Large fleshy lower extremity noted. Inferior calcaneal spur noted. Advanced HAV deformity noted. Pes planus.  Radiographic compression: No acute bony abnormality noted left foot.              Assessment & Plan:  Assessment: Obesity Generalized arthralgia/sprain/strain feet ankles bilaterally associated with obesity HAV deformities Pes planus bilaterally  Plan: Patient relates a weight reduction from 500 pounds to occurred estimated weight of 378 pounds. I encouraged her to continue weight reduction.  We discussed athletic style shoeing. We discussed exercise primarily deep water aerobics.  Reappoint at patient's request

## 2014-02-17 ENCOUNTER — Encounter: Payer: Self-pay | Admitting: Podiatry

## 2014-02-20 DIAGNOSIS — I2699 Other pulmonary embolism without acute cor pulmonale: Secondary | ICD-10-CM

## 2014-02-20 HISTORY — DX: Other pulmonary embolism without acute cor pulmonale: I26.99

## 2014-02-27 ENCOUNTER — Encounter (HOSPITAL_COMMUNITY): Payer: Self-pay | Admitting: Emergency Medicine

## 2014-02-27 ENCOUNTER — Inpatient Hospital Stay (HOSPITAL_COMMUNITY)
Admission: EM | Admit: 2014-02-27 | Discharge: 2014-03-01 | DRG: 176 | Disposition: A | Discharge status: Home / Self Care | Payer: Medicare HMO | Attending: Internal Medicine | Admitting: Internal Medicine

## 2014-02-27 ENCOUNTER — Emergency Department (HOSPITAL_COMMUNITY): Payer: Medicare HMO

## 2014-02-27 DIAGNOSIS — J449 Chronic obstructive pulmonary disease, unspecified: Secondary | ICD-10-CM | POA: Diagnosis present

## 2014-02-27 DIAGNOSIS — J4489 Other specified chronic obstructive pulmonary disease: Secondary | ICD-10-CM | POA: Diagnosis present

## 2014-02-27 DIAGNOSIS — I10 Essential (primary) hypertension: Secondary | ICD-10-CM | POA: Diagnosis present

## 2014-02-27 DIAGNOSIS — J019 Acute sinusitis, unspecified: Secondary | ICD-10-CM | POA: Diagnosis present

## 2014-02-27 DIAGNOSIS — G8929 Other chronic pain: Secondary | ICD-10-CM | POA: Diagnosis present

## 2014-02-27 DIAGNOSIS — I2699 Other pulmonary embolism without acute cor pulmonale: Principal | ICD-10-CM | POA: Diagnosis present

## 2014-02-27 DIAGNOSIS — D649 Anemia, unspecified: Secondary | ICD-10-CM | POA: Diagnosis present

## 2014-02-27 DIAGNOSIS — Z791 Long term (current) use of non-steroidal anti-inflammatories (NSAID): Secondary | ICD-10-CM

## 2014-02-27 DIAGNOSIS — M549 Dorsalgia, unspecified: Secondary | ICD-10-CM

## 2014-02-27 DIAGNOSIS — Z888 Allergy status to other drugs, medicaments and biological substances status: Secondary | ICD-10-CM

## 2014-02-27 DIAGNOSIS — Z6841 Body Mass Index (BMI) 40.0 and over, adult: Secondary | ICD-10-CM

## 2014-02-27 DIAGNOSIS — M25559 Pain in unspecified hip: Secondary | ICD-10-CM | POA: Diagnosis present

## 2014-02-27 DIAGNOSIS — J440 Chronic obstructive pulmonary disease with acute lower respiratory infection: Secondary | ICD-10-CM | POA: Diagnosis present

## 2014-02-27 DIAGNOSIS — Z885 Allergy status to narcotic agent status: Secondary | ICD-10-CM

## 2014-02-27 DIAGNOSIS — J0110 Acute frontal sinusitis, unspecified: Secondary | ICD-10-CM | POA: Diagnosis present

## 2014-02-27 DIAGNOSIS — J0100 Acute maxillary sinusitis, unspecified: Secondary | ICD-10-CM | POA: Diagnosis present

## 2014-02-27 DIAGNOSIS — J209 Acute bronchitis, unspecified: Secondary | ICD-10-CM | POA: Diagnosis present

## 2014-02-27 DIAGNOSIS — E119 Type 2 diabetes mellitus without complications: Secondary | ICD-10-CM | POA: Diagnosis present

## 2014-02-27 DIAGNOSIS — Z87891 Personal history of nicotine dependence: Secondary | ICD-10-CM

## 2014-02-27 DIAGNOSIS — Z794 Long term (current) use of insulin: Secondary | ICD-10-CM

## 2014-02-27 DIAGNOSIS — Z86711 Personal history of pulmonary embolism: Secondary | ICD-10-CM | POA: Diagnosis present

## 2014-02-27 DIAGNOSIS — E669 Obesity, unspecified: Secondary | ICD-10-CM | POA: Diagnosis present

## 2014-02-27 HISTORY — DX: Pain in unspecified hip: M25.559

## 2014-02-27 HISTORY — DX: Chronic obstructive pulmonary disease, unspecified: J44.9

## 2014-02-27 HISTORY — DX: Other chronic pain: G89.29

## 2014-02-27 LAB — PRO B NATRIURETIC PEPTIDE: Pro B Natriuretic peptide (BNP): 36.9 pg/mL (ref 0–125)

## 2014-02-27 LAB — CBG MONITORING, ED
Glucose-Capillary: 116 mg/dL — ABNORMAL HIGH (ref 70–99)
Glucose-Capillary: 72 mg/dL (ref 70–99)

## 2014-02-27 LAB — CBC
HEMATOCRIT: 34.8 % — AB (ref 36.0–46.0)
Hemoglobin: 11.2 g/dL — ABNORMAL LOW (ref 12.0–15.0)
MCH: 27.2 pg (ref 26.0–34.0)
MCHC: 32.2 g/dL (ref 30.0–36.0)
MCV: 84.5 fL (ref 78.0–100.0)
Platelets: 228 10*3/uL (ref 150–400)
RBC: 4.12 MIL/uL (ref 3.87–5.11)
RDW: 15.6 % — AB (ref 11.5–15.5)
WBC: 10 10*3/uL (ref 4.0–10.5)

## 2014-02-27 LAB — I-STAT TROPONIN, ED: TROPONIN I, POC: 0 ng/mL (ref 0.00–0.08)

## 2014-02-27 LAB — GLUCOSE, CAPILLARY: GLUCOSE-CAPILLARY: 122 mg/dL — AB (ref 70–99)

## 2014-02-27 LAB — I-STAT CHEM 8, ED
BUN: 9 mg/dL (ref 6–23)
CHLORIDE: 106 meq/L (ref 96–112)
CREATININE: 1.1 mg/dL (ref 0.50–1.10)
Calcium, Ion: 1.32 mmol/L — ABNORMAL HIGH (ref 1.12–1.23)
Glucose, Bld: 143 mg/dL — ABNORMAL HIGH (ref 70–99)
HCT: 38 % (ref 36.0–46.0)
Hemoglobin: 12.9 g/dL (ref 12.0–15.0)
POTASSIUM: 3.6 meq/L — AB (ref 3.7–5.3)
SODIUM: 146 meq/L (ref 137–147)
TCO2: 25 mmol/L (ref 0–100)

## 2014-02-27 LAB — D-DIMER, QUANTITATIVE: D-Dimer, Quant: 1.3 ug/mL-FEU — ABNORMAL HIGH (ref 0.00–0.48)

## 2014-02-27 LAB — I-STAT CG4 LACTIC ACID, ED: LACTIC ACID, VENOUS: 1.89 mmol/L (ref 0.5–2.2)

## 2014-02-27 MED ORDER — ONDANSETRON HCL 4 MG/2ML IJ SOLN: 4.0000 mg | Freq: Four times a day (QID) | INTRAMUSCULAR | Status: DC | PRN

## 2014-02-27 MED ORDER — SODIUM CHLORIDE 0.9 % IV SOLN: 250.0000 mL | INTRAVENOUS | Status: DC | PRN

## 2014-02-27 MED ORDER — ENOXAPARIN SODIUM 150 MG/ML ~~LOC~~ SOLN: 1.0000 mg/kg | Freq: Two times a day (BID) | SUBCUTANEOUS | Status: DC

## 2014-02-27 MED ORDER — GABAPENTIN 600 MG PO TABS
600.0000 mg | ORAL_TABLET | Freq: Every day | ORAL | Status: DC
  Administered 2014-02-28 (×2): 600 mg via ORAL
  Filled 2014-02-27 (×3): qty 1

## 2014-02-27 MED ORDER — VITAMIN D (ERGOCALCIFEROL) 1.25 MG (50000 UNIT) PO CAPS
50000.0000 [IU] | ORAL_CAPSULE | ORAL | Status: DC
  Administered 2014-03-01: 50000 [IU] via ORAL
  Filled 2014-02-27: qty 1

## 2014-02-27 MED ORDER — OXYCODONE HCL 5 MG PO TABS
15.0000 mg | ORAL_TABLET | ORAL | Status: DC | PRN
  Administered 2014-02-28 – 2014-03-01 (×3): 15 mg via ORAL
  Filled 2014-02-27 (×3): qty 3

## 2014-02-27 MED ORDER — GABAPENTIN 300 MG PO CAPS
300.0000 mg | ORAL_CAPSULE | Freq: Two times a day (BID) | ORAL | Status: DC
  Administered 2014-02-28 – 2014-03-01 (×3): 300 mg via ORAL
  Filled 2014-02-27 (×5): qty 1

## 2014-02-27 MED ORDER — INSULIN ASPART 100 UNIT/ML ~~LOC~~ SOLN: 0.0000 [IU] | Freq: Every day | SUBCUTANEOUS | Status: DC

## 2014-02-27 MED ORDER — NITROGLYCERIN 0.4 MG SL SUBL
0.4000 mg | SUBLINGUAL_TABLET | SUBLINGUAL | Status: DC | PRN
  Administered 2014-02-27 (×2): 0.4 mg via SUBLINGUAL
  Filled 2014-02-27: qty 1

## 2014-02-27 MED ORDER — SODIUM CHLORIDE 0.9 % IJ SOLN: 3.0000 mL | INTRAMUSCULAR | Status: DC | PRN

## 2014-02-27 MED ORDER — LISINOPRIL-HYDROCHLOROTHIAZIDE 20-25 MG PO TABS: 1.0000 | ORAL_TABLET | Freq: Every day | ORAL | Status: DC

## 2014-02-27 MED ORDER — IPRATROPIUM-ALBUTEROL 0.5-2.5 (3) MG/3ML IN SOLN: 3.0000 mL | Freq: Four times a day (QID) | RESPIRATORY_TRACT | Status: DC | PRN

## 2014-02-27 MED ORDER — ONDANSETRON HCL 4 MG PO TABS: 4.0000 mg | ORAL_TABLET | Freq: Four times a day (QID) | ORAL | Status: DC | PRN

## 2014-02-27 MED ORDER — GABAPENTIN 600 MG PO TABS: 600.0000 mg | ORAL_TABLET | Freq: Three times a day (TID) | ORAL | Status: DC

## 2014-02-27 MED ORDER — SODIUM CHLORIDE 0.9 % IJ SOLN
3.0000 mL | Freq: Two times a day (BID) | INTRAMUSCULAR | Status: DC
  Administered 2014-03-01: 3 mL via INTRAVENOUS

## 2014-02-27 MED ORDER — IOHEXOL 350 MG/ML SOLN
100.0000 mL | Freq: Once | INTRAVENOUS | Status: AC | PRN
  Administered 2014-02-27: 100 mL via INTRAVENOUS

## 2014-02-27 MED ORDER — ALUM & MAG HYDROXIDE-SIMETH 200-200-20 MG/5ML PO SUSP: 30.0000 mL | Freq: Four times a day (QID) | ORAL | Status: DC | PRN

## 2014-02-27 MED ORDER — OXYCODONE HCL 5 MG PO TABS: 15.0000 mg | ORAL_TABLET | ORAL | Status: DC | PRN

## 2014-02-27 MED ORDER — IPRATROPIUM-ALBUTEROL 18-103 MCG/ACT IN AERO: 2.0000 | INHALATION_SPRAY | Freq: Four times a day (QID) | RESPIRATORY_TRACT | Status: DC | PRN

## 2014-02-27 MED ORDER — INSULIN ASPART 100 UNIT/ML ~~LOC~~ SOLN
0.0000 [IU] | Freq: Three times a day (TID) | SUBCUTANEOUS | Status: DC
  Administered 2014-02-28: 3 [IU] via SUBCUTANEOUS
  Administered 2014-03-01: 1 [IU] via SUBCUTANEOUS

## 2014-02-27 MED ORDER — NITROGLYCERIN 0.4 MG SL SUBL: 0.4000 mg | SUBLINGUAL_TABLET | SUBLINGUAL | Status: DC | PRN

## 2014-02-27 MED ORDER — ACETAMINOPHEN 650 MG RE SUPP: 650.0000 mg | Freq: Four times a day (QID) | RECTAL | Status: DC | PRN

## 2014-02-27 MED ORDER — INSULIN GLARGINE 100 UNIT/ML ~~LOC~~ SOLN
100.0000 [IU] | Freq: Every day | SUBCUTANEOUS | Status: DC
  Administered 2014-02-28: 100 [IU] via SUBCUTANEOUS
  Filled 2014-02-27 (×3): qty 1

## 2014-02-27 MED ORDER — OXYBUTYNIN CHLORIDE ER 5 MG PO TB24
5.0000 mg | ORAL_TABLET | Freq: Every morning | ORAL | Status: DC
  Administered 2014-02-28 – 2014-03-01 (×2): 5 mg via ORAL
  Filled 2014-02-27 (×2): qty 1

## 2014-02-27 MED ORDER — ACETAMINOPHEN 325 MG PO TABS
650.0000 mg | ORAL_TABLET | Freq: Four times a day (QID) | ORAL | Status: DC | PRN
  Administered 2014-03-01: 650 mg via ORAL
  Filled 2014-02-27: qty 2

## 2014-02-27 MED ORDER — VALACYCLOVIR HCL 500 MG PO TABS
500.0000 mg | ORAL_TABLET | Freq: Two times a day (BID) | ORAL | Status: DC
  Administered 2014-02-28 – 2014-03-01 (×4): 500 mg via ORAL
  Filled 2014-02-27 (×5): qty 1

## 2014-02-27 MED ORDER — HYDROMORPHONE HCL PF 1 MG/ML IJ SOLN: 0.5000 mg | INTRAMUSCULAR | Status: DC | PRN

## 2014-02-27 MED ORDER — ERGOCALCIFEROL 1.25 MG (50000 UT) PO CAPS: 50000.0000 [IU] | ORAL_CAPSULE | ORAL | Status: DC

## 2014-02-27 MED ORDER — HYDROCHLOROTHIAZIDE 25 MG PO TABS
25.0000 mg | ORAL_TABLET | Freq: Every day | ORAL | Status: DC
  Administered 2014-02-28: 25 mg via ORAL
  Filled 2014-02-27 (×2): qty 1

## 2014-02-27 MED ORDER — SODIUM CHLORIDE 0.9 % IJ SOLN
3.0000 mL | Freq: Two times a day (BID) | INTRAMUSCULAR | Status: DC
  Administered 2014-02-28 (×3): 3 mL via INTRAVENOUS

## 2014-02-27 MED ORDER — INSULIN GLARGINE 100 UNIT/ML SOLOSTAR PEN: 100.0000 [IU] | PEN_INJECTOR | Freq: Every day | SUBCUTANEOUS | Status: DC

## 2014-02-27 MED ORDER — LISINOPRIL 20 MG PO TABS
20.0000 mg | ORAL_TABLET | Freq: Every day | ORAL | Status: DC
  Administered 2014-02-28 – 2014-03-01 (×2): 20 mg via ORAL
  Filled 2014-02-27 (×2): qty 1

## 2014-02-27 MED ORDER — ENOXAPARIN SODIUM 150 MG/ML ~~LOC~~ SOLN
170.0000 mg | Freq: Two times a day (BID) | SUBCUTANEOUS | Status: DC
  Administered 2014-02-28 (×2): 170 mg via SUBCUTANEOUS
  Filled 2014-02-27 (×6): qty 2

## 2014-02-27 MED ORDER — LORAZEPAM 2 MG/ML IJ SOLN
1.0000 mg | Freq: Once | INTRAMUSCULAR | Status: AC
  Administered 2014-02-27: 1 mg via INTRAVENOUS
  Filled 2014-02-27: qty 1

## 2014-02-27 NOTE — ED Notes (Signed)
Per Lovell SheehanJenkins, MD pt is allowed to eat.

## 2014-02-27 NOTE — ED Notes (Signed)
[facility] presents with a 53 yo female from home with CP.  Pt had been cutting grass, stopped to talk and pt started feeling left sided CP radiating to left arm with difficulty breathing/SOB;  [facility] gave 324 ASA and 1 NTG and pain subsided as well as nausea.  Intial VS 160/100 with pulse rate of 118 and O2 SATs at 92% on scene.  Hx. Of asthma, COPD, diabetes, and hypertension.  NSR on monitor.

## 2014-02-27 NOTE — ED Provider Notes (Signed)
CSN: [id number]     Arrival date & time 02/27/14  1441 History   First MD Initiated Contact with Patient 02/27/14 1504     Chief Complaint  Patient presents with  . Chest Pain     (Consider location/radiation/quality/duration/timing/severity/associated sxs/prior Treatment) HPI Complains of anterior chest pain onset 5 or 10 minutes after mowing the grass this afternoon. Pain is anterior and left-sided parasternal. Quality pleuritic associated symptoms include numbness in her left shoulder and shortness of breath. EMS treated patient with one sublingual nitroglycerin and 4 baby aspirin with partial relief. The patient denies nausea. Pain is intermittent lasting 3-5 minutes at a time. Nothing makes symptoms better or worse. Past Medical History  Diagnosis Date  . Obesity   . Diabetes mellitus without complication   . Hypertension   . Asthma    cardiac risk factors ex-smoker, family history hypertension diabetes Past Surgical History  Procedure Laterality Date  . Breast reduction surgery    . Hernia repair    . Tubal ligation    . Ankle arthroscopy     No family history on file. History  Substance Use Topics  . Smoking status: Never Smoker   . Smokeless tobacco: Not on file  . Alcohol Use: No   social history ex-smokerquirt 2001 no alcohol no drug OB History   Grav Para Term Preterm Abortions TAB SAB Ect Mult Living                 Review of Systems  Constitutional: Negative.   HENT: Negative.   Respiratory: Positive for shortness of breath.   Cardiovascular: Positive for chest pain.  Gastrointestinal: Negative.   Musculoskeletal: Positive for arthralgias and back pain.       Chronic back pain and chronic hip pain  Skin: Negative.   Neurological: Negative.   Psychiatric/Behavioral: Negative.   All other systems reviewed and are negative.     Allergies  Ceftin; Celebrex; Coconut oil; Hydrocodone; and Mushroom extract complex  Home Medications   Prior to Admission  medications   Medication Sig Start Date End Date Taking? Authorizing Provider  albuterol (PROVENTIL HFA;VENTOLIN HFA) 108 (90 BASE) MCG/ACT inhaler Inhale 2 puffs into the lungs every 6 (six) hours as needed for wheezing or shortness of breath.    Historical Provider, MD  albuterol-ipratropium (COMBIVENT) 18-103 MCG/ACT inhaler Inhale 2 puffs into the lungs every 6 (six) hours as needed for wheezing. 08/22/13   Rolland Porter, MD  azithromycin (ZITHROMAX Z-PAK) 250 MG tablet X 5 days as directed 08/22/13   Rolland Porter, MD  buprenorphine Lavera Guise) 10 MCG/HR PTWK patch Place 10 mcg onto the skin once a week. On Mondays    Historical Provider, MD  fluticasone Aleda Grana) 50 MCG/ACT nasal spray 1 spray q nares bid 08/22/13   Rolland Porter, MD  gabapentin (NEURONTIN) 600 MG tablet Take 600 mg by mouth 3 (three) times daily. 1/2 tablet (300 mg) every morning and afternoon, 1 tablet (600 mg) at bedtime    Historical Provider, MD  hydrochlorothiazide (HYDRODIURIL) 25 MG tablet Take 1 tablet (25 mg total) by mouth daily. 08/22/13   Rolland Porter, MD  insulin aspart (NOVOLOG FLEXPEN) 100 UNIT/ML SOPN FlexPen Inject 10-20 Units into the skin 3 (three) times daily with meals. Per sliding scale    Historical Provider, MD  Insulin Glargine (LANTUS SOLOSTAR) 100 UNIT/ML SOPN Inject 85 Units into the skin at bedtime.    Historical Provider, MD  lisinopril-hydrochlorothiazide (PRINZIDE,ZESTORETIC) 10-12.5 MG per tablet Take 1 tablet by mouth  daily.    Historical Provider, MD  meloxicam (MOBIC) 15 MG tablet Take 15 mg by mouth daily.    Historical Provider, MD  oxyCODONE-acetaminophen (PERCOCET/ROXICET) 5-325 MG per tablet Take 2 tablets by mouth every 4 (four) hours as needed for pain. 08/22/13   Rolland PorterMark James, MD  tolterodine (DETROL LA) 4 MG 24 hr capsule Take 4 mg by mouth daily.    Historical Provider, MD  valACYclovir (VALTREX) 500 MG tablet Take 500 mg by mouth 2 (two) times daily.    Historical Provider, MD   BP 101/55   Pulse 107  Temp(Src) 98.5 F (36.9 C) (Oral)  SpO2 99% Physical Exam  ED Course  Procedures (including critical care time) Labs Review Labs Reviewed  APTT  CBC  COMPREHENSIVE METABOLIC PANEL  PRO B NATRIURETIC PEPTIDE  MAGNESIUM  PROTIME-INR  URINALYSIS, ROUTINE W REFLEX MICROSCOPIC  I-STAT TROPOININ, ED    Imaging Review No results found.   EKG Interpretation   Date/Time:  Friday Feb 27 2014 14:51:17 EDT Ventricular Rate:  100 PR Interval:  138 QRS Duration: 96 QT Interval:  362 QTC Calculation: 466 R Axis:   69 Text Interpretation:  Normal sinus rhythm Nonspecific T wave abnormality  Prolonged QT Abnormal ECG Nonspecific T wave abnormality now evident in  Inferior leads Confirmed by Ethelda ChickJACUBOWITZ  MD, Nyema Hachey [phone]([id number]) on 02/27/2014  3:05:51 PM      Results for orders placed during the hospital encounter of 02/27/14  CBC      Result Value Ref Range   WBC 10.0  4.0 - 10.5 K/uL   RBC 4.12  3.87 - 5.11 MIL/uL   Hemoglobin 11.2 (*) 12.0 - 15.0 g/dL   HCT 19.134.8 (*) 47.836.0 - 29.546.0 %   MCV 84.5  78.0 - 100.0 fL   MCH 27.2  26.0 - 34.0 pg   MCHC 32.2  30.0 - 36.0 g/dL   RDW 62.115.6 (*) 30.811.5 - 65.715.5 %   Platelets 228  150 - 400 K/uL  PRO B NATRIURETIC PEPTIDE      Result Value Ref Range   Pro B Natriuretic peptide (BNP) 36.9  0 - 125 pg/mL  D-DIMER, QUANTITATIVE      Result Value Ref Range   D-Dimer, Quant 1.30 (*) 0.00 - 0.48 ug/mL-FEU  I-STAT TROPOININ, ED      Result Value Ref Range   Troponin i, poc 0.00  0.00 - 0.08 ng/mL   Comment 3           CBG MONITORING, ED      Result Value Ref Range   Glucose-Capillary 116 (*) 70 - 99 mg/dL  I-STAT CHEM 8, ED      Result Value Ref Range   Sodium 146  137 - 147 mEq/L   Potassium 3.6 (*) 3.7 - 5.3 mEq/L   Chloride 106  96 - 112 mEq/L   BUN 9  6 - 23 mg/dL   Creatinine, Ser 8.461.10  0.50 - 1.10 mg/dL   Glucose, Bld 962143 (*) 70 - 99 mg/dL   Calcium, Ion 9.521.32 (*) 1.12 - 1.23 mmol/L   TCO2 25  0 - 100 mmol/L   Hemoglobin 12.9  12.0 -  15.0 g/dL   HCT 84.138.0  32.436.0 - 40.146.0 %  I-STAT CG4 LACTIC ACID, ED      Result Value Ref Range   Lactic Acid, Venous 1.89  0.5 - 2.2 mmol/L   Dg Ankle Complete Left  02/17/2014   X-ray examination left ankle  Intact  bony structure without fracture or dislocation noted. Large fleshy  lower extremity noted. The ankle mortise is intact. Pes planus noted.  Radiographic impression: no acute bony abnormality noted  Dg Ankle Complete Right  02/17/2014   X-ray report right ankle  Intact bony structure without fracture or dislocation noted. Large fleshy  lower extremity noted. The ankle mortise is intact. Pes planus noted.  Inferior calcaneal spur noted.  Radiographic impression: No acute bony abnormality noted  Dg Chest Port 1 View  02/27/2014   CLINICAL DATA:  Chest pain.  EXAM: PORTABLE CHEST - 1 VIEW  COMPARISON:  08/22/2013  FINDINGS: Cardiac silhouette appears mildly enlarged, accentuated by portable AP technique and degree of inspiration. Lungs are less well inflated than on the prior study with mild vascular crowding but without evidence of airspace consolidation, overt edema, pleural effusion, or pneumothorax. No acute osseous abnormality is identified.  IMPRESSION: Shallower lung inflation without evidence of acute cardiopulmonary process.   Electronically Signed   By: Sebastian AcheAllen  Grady   On: 02/27/2014 16:17   Dg Foot Complete Left  02/17/2014   X-ray examination left foot  Intact bony structure without fracture or dislocation noted. Large fleshy  lower extremity noted. Inferior calcaneal spur noted. Advanced HAV  deformity noted. Pes planus.  Radiographic compression: No acute bony abnormality noted left foot.  Dg Foot Complete Right  02/17/2014   X-ray examination right foot  Intact bony structure without fracture or dislocation noted. Large fleshy  lower extremity noted. Inferior calcaneal spur noted. Advanced HAV  deformity noted. Pes planus.  Radiographic impression: No acute bony abnormality noted  right foot.   '4 30 p.m. patient pain free after treatment with sublingual nitroglycerin Results for orders placed during the hospital encounter of 02/27/14  CBC      Result Value Ref Range   WBC 10.0  4.0 - 10.5 K/uL   RBC 4.12  3.87 - 5.11 MIL/uL   Hemoglobin 11.2 (*) 12.0 - 15.0 g/dL   HCT 16.134.8 (*) 09.636.0 - 04.546.0 %   MCV 84.5  78.0 - 100.0 fL   MCH 27.2  26.0 - 34.0 pg   MCHC 32.2  30.0 - 36.0 g/dL   RDW 40.915.6 (*) 81.111.5 - 91.415.5 %   Platelets 228  150 - 400 K/uL  PRO B NATRIURETIC PEPTIDE      Result Value Ref Range   Pro B Natriuretic peptide (BNP) 36.9  0 - 125 pg/mL  D-DIMER, QUANTITATIVE      Result Value Ref Range   D-Dimer, Quant 1.30 (*) 0.00 - 0.48 ug/mL-FEU  I-STAT TROPOININ, ED      Result Value Ref Range   Troponin i, poc 0.00  0.00 - 0.08 ng/mL   Comment 3           CBG MONITORING, ED      Result Value Ref Range   Glucose-Capillary 116 (*) 70 - 99 mg/dL  I-STAT CHEM 8, ED      Result Value Ref Range   Sodium 146  137 - 147 mEq/L   Potassium 3.6 (*) 3.7 - 5.3 mEq/L   Chloride 106  96 - 112 mEq/L   BUN 9  6 - 23 mg/dL   Creatinine, Ser 7.821.10  0.50 - 1.10 mg/dL   Glucose, Bld 956143 (*) 70 - 99 mg/dL   Calcium, Ion 2.131.32 (*) 1.12 - 1.23 mmol/L   TCO2 25  0 - 100 mmol/L   Hemoglobin 12.9  12.0 - 15.0 g/dL  HCT 38.0  36.0 - 46.0 %  I-STAT CG4 LACTIC ACID, ED      Result Value Ref Range   Lactic Acid, Venous 1.89  0.5 - 2.2 mmol/L   Dg Ankle Complete Left  02/17/2014   X-ray examination left ankle  Intact bony structure without fracture or dislocation noted. Large fleshy  lower extremity noted. The ankle mortise is intact. Pes planus noted.  Radiographic impression: no acute bony abnormality noted  Dg Ankle Complete Right  02/17/2014   X-ray report right ankle  Intact bony structure without fracture or dislocation noted. Large fleshy  lower extremity noted. The ankle mortise is intact. Pes planus noted.  Inferior calcaneal spur noted.  Radiographic impression: No acute  bony abnormality noted  Ct Angio Chest Pe W/cm &/or Wo Cm  02/27/2014   CLINICAL DATA:  Left upper chest pain, elevated D-dimer  EXAM: CT ANGIOGRAPHY CHEST WITH CONTRAST  TECHNIQUE: Multidetector CT imaging of the chest was performed using the standard protocol during bolus administration of intravenous contrast. Multiplanar CT image reconstructions and MIPs were obtained to evaluate the vascular anatomy.  CONTRAST:  8 ml Omnipaque 350, followed by OMNIPAQUE IOHEXOL 350 MG/ML SOLN  COMPARISON:  DG CHEST 1V PORT dated 02/27/2014; DG CHEST 2 VIEW dated 08/22/2013  FINDINGS: The study was repeated due to poor opacification of the pulmonary arterial system. The second set of images also suffers from suboptimal opacification of the pulmonary arterial system. This is related to patient body habitus.  There is bilateral atelectatic change. No pleural or pericardial effusion. No evidence of pneumonia or adenopathy.  The central pulmonary arteries are normal. Evaluation of the more peripheral vessels is very difficult and quite limited. However, several peripheral arterial branches supplying the posterior and posterior medial left lung base show evidence of relative under opacification even when compared to neighboring vessels. There are no other abnormalities. There are no acute musculoskeletal findings. Scans through the upper abdomen are unremarkable.  Review of the MIP images confirms the above findings.  IMPRESSION: Moderately limited study, with evidence to suggest small peripheral emboli involving distal pulmonary arteries supplying the left medial lung base.   Electronically Signed   By: Esperanza Heir M.D.   On: 02/27/2014 19:52   Dg Chest Port 1 View  02/27/2014   CLINICAL DATA:  Chest pain.  EXAM: PORTABLE CHEST - 1 VIEW  COMPARISON:  08/22/2013  FINDINGS: Cardiac silhouette appears mildly enlarged, accentuated by portable AP technique and degree of inspiration. Lungs are less well inflated than on the  prior study with mild vascular crowding but without evidence of airspace consolidation, overt edema, pleural effusion, or pneumothorax. No acute osseous abnormality is identified.  IMPRESSION: Shallower lung inflation without evidence of acute cardiopulmonary process.   Electronically Signed   By: Sebastian Ache   On: 02/27/2014 16:17   Dg Foot Complete Left  02/17/2014   X-ray examination left foot  Intact bony structure without fracture or dislocation noted. Large fleshy  lower extremity noted. Inferior calcaneal spur noted. Advanced HAV  deformity noted. Pes planus.  Radiographic compression: No acute bony abnormality noted left foot.  Dg Foot Complete Right  02/17/2014   X-ray examination right foot  Intact bony structure without fracture or dislocation noted. Large fleshy  lower extremity noted. Inferior calcaneal spur noted. Advanced HAV  deformity noted. Pes planus.  Radiographic impression: No acute bony abnormality noted right foot.  Chest xray viewed by me MDM   Final diagnoses:  None  in light of elevated d-dimer, pleuritic component chest pain and complained of shortness of breath will obtain CT angiogram chest Spoke with Dr. Lovell Sheehan plan admit telemetry, Lovenox pharmacy to dose Diagnosis #1 chest pain #2 pulmonary embolism #3 anemia     Doug Sou, MD 02/27/14 2030

## 2014-02-27 NOTE — ED Notes (Signed)
CBG 116  

## 2014-02-27 NOTE — Progress Notes (Signed)
ANTICOAGULATION CONSULT NOTE - Initial Consult  Pharmacy Consult for lovenox Indication: pulmonary embolus  Allergies  Allergen Reactions  . Ceftin [Cefuroxime Axetil] Shortness Of Breath, Nausea And Vomiting and Rash  . Celebrex [Celecoxib] Other (See Comments)    Involuntary leg twitching and leg pain  . Coconut Oil Nausea Only  . Hydrocodone Nausea And Vomiting    Severe vomiting  . Mushroom Extract Complex Nausea And Vomiting    Patient Measurements:    Vital Signs: Temp: 98.5 F (36.9 C) (05/08 1459) Temp src: Oral (05/08 1459) BP: 125/65 mmHg (05/08 1800) Pulse Rate: 88 (05/08 1800)  Labs:  Recent Labs  02/27/14 1452 02/27/14 1639  HGB 11.2* 12.9  HCT 34.8* 38.0  PLT 228  --   CREATININE  --  1.10    The CrCl is unknown because both a height and weight (above a minimum accepted value) are required for this calculation.   Medical History: Past Medical History  Diagnosis Date  . Obesity   . Diabetes mellitus without complication   . Hypertension   . Asthma   . COPD (chronic obstructive pulmonary disease)   . Back pain, chronic   . Hip pain, chronic     Medications:  See med history  Assessment: [age] yof presented to the ED with CP. D-dimer elevated and found to have a PE via CT scan. To start lovenox for anticoagulation. Pt is morbidly obese so will likely need additional monitoring while on lovenox. Baseline CBC is WNL and she is not on any anticoagulation PTA.   Goal of Therapy:  Anti-Xa level 0.6-1 units/ml 4hrs after LMWH dose given Monitor platelets by anticoagulation protocol: Yes   Plan:  1. Lovenox 170mg  SQ Q12H 2. CBC Q72H while on lovenox 3. F/u plans for oral anticoagulation 4. If continuing lovenox as a bridge to coumadin, will consider checking an anti-Xa level to ensure appropriate dosing in obese pt  Drake LeachRachel Lynn Curtistine Pettitt 02/27/2014,8:44 PM

## 2014-02-27 NOTE — H&P (Signed)
[facility] History and Physical  Kirsten Gonzalez BJY:[id number] DOB: May 20, 1961 DOA: 02/27/2014  Referring physician: EDP PCP: Charlette CaffeyParuchuri, Lakshmi P, MD  Specialists:   Chief Complaint: Chest Pain and SOB  HPI: Kirsten Gonzalez is a 53 y.o. female with a history of DM2, HTN and Copd who egan to have pleuritic chest pain after she had finished mowing her lawn.  She reports going into her house to take a break and she suddening had pain when she took a deep breath and she report the pain was in the left side and rated the pain at a 10/10.  EMS was called and she was given a SL NTG which partially improved the pain but the pain was not relieved until she had been given a total ot 4 SL NTG tablets.    She was evaluated in the ED and found to have an elevated D-Dimer at 1.30.    The CTA of Chest revealed findings suggestive of a distal peripheral left lung pulmonary embolism.       Review of Systems:  Constitutional: No Weight Loss, No Weight Gain, Night Sweats, Fevers, Chills, Fatigue, or Generalized Weakness HEENT: No Headaches, Difficulty Swallowing,Tooth/Dental Problems,Sore Throat,  No Sneezing, Rhinitis, Ear Ache, Nasal Congestion, or Post Nasal Drip,  Cardio-vascular:  +Chest pain, Orthopnea, PND, Edema in lower extremities, Anasarca, Dizziness, Palpitations  Resp: +Dyspnea, No DOE, No Cough, No Hemoptysis, No Wheezing.    GI: No Heartburn, Indigestion, Abdominal Pain, Nausea, Vomiting, Diarrhea, Change in Bowel Habits,  Loss of Appetite  GU: No Dysuria, Change in Color of Urine, No Urgency or Frequency.  No flank pain.  Musculoskeletal: No Joint Pain or Swelling.  No Decreased Range of Motion. +chronic  Back Pain.  Neurologic: No Syncope, No Seizures, Muscle Weakness, Paresthesia, Vision Disturbance or Loss, No Diplopia, No Vertigo, No Difficulty Walking,  Skin: No Rash or Lesions. Psych: No Change in Mood or Affect. No Depression or Anxiety. No Memory loss. No Confusion or  Hallucinations   Past Medical History  Diagnosis Date  . Obesity   . Diabetes mellitus without complication   . Hypertension   . Asthma   . COPD (chronic obstructive pulmonary disease)   . Back pain, chronic   . Hip pain, chronic       Past Surgical History  Procedure Laterality Date  . Breast reduction surgery    . Hernia repair    . Tubal ligation    . Ankle arthroscopy         Prior to Admission medications   Medication Sig Start Date End Date Taking? Authorizing Provider  albuterol-ipratropium (COMBIVENT) 18-103 MCG/ACT inhaler Inhale 2 puffs into the lungs every 6 (six) hours as needed for wheezing. 08/22/13  Yes Rolland PorterMark James, MD  ergocalciferol (VITAMIN D2) 50000 UNITS capsule Take 50,000 Units by mouth every Sunday.   Yes Historical Provider, MD  gabapentin (NEURONTIN) 600 MG tablet Take 600 mg by mouth 3 (three) times daily. 1/2 tablet (300 mg) every morning and afternoon, 1 tablet (600 mg) at bedtime   Yes Historical Provider, MD  insulin aspart (NOVOLOG FLEXPEN) 100 UNIT/ML SOPN FlexPen Inject 10-20 Units into the skin 3 (three) times daily with meals. Per sliding scale   Yes Historical Provider, MD  Insulin Glargine (LANTUS SOLOSTAR) 100 UNIT/ML SOPN Inject 100 Units into the skin daily at 10 pm.    Yes Historical Provider, MD  lisinopril-hydrochlorothiazide (PRINZIDE,ZESTORETIC) 20-25 MG per tablet Take 1 tablet by mouth daily.   Yes Historical  Provider, MD  oxybutynin (DITROPAN-XL) 5 MG 24 hr tablet Take 5 mg by mouth every morning.   Yes Historical Provider, MD  oxyCODONE (ROXICODONE) 15 MG immediate release tablet Take 15 mg by mouth every 4 (four) hours as needed for pain.   Yes Historical Provider, MD  valACYclovir (VALTREX) 500 MG tablet Take 500 mg by mouth 2 (two) times daily.   Yes Historical Provider, MD      Allergies  Allergen Reactions  . Ceftin [Cefuroxime Axetil] Shortness Of Breath, Nausea And Vomiting and Rash  . Celebrex [Celecoxib] Other (See  Comments)    Involuntary leg twitching and leg pain  . Coconut Oil Nausea Only  . Hydrocodone Nausea And Vomiting    Severe vomiting  . Mushroom Extract Complex Nausea And Vomiting     Social History:  reports that she quit smoking about 14 years ago. Her smoking use included Cigarettes and Cigars. She smoked 0.00 packs per day. She has never used smokeless tobacco. She reports that she does not drink alcohol or use illicit drugs.     History reviewed. No pertinent family history.     Physical Exam:  GEN:  Pleasant Obese  52 y.o.   African American female  examined  and in no acute distress; cooperative with exam Filed Vitals:   02/27/14 1730 02/27/14 1800 02/27/14 2030 02/27/14 2134  BP: 113/74 125/65 123/72 148/93  Pulse: 84 88 87 90  Temp:    97.6 F (36.4 C)  TempSrc:    Oral  Resp: 14 17 19    Height:    5\' 9"  (1.753 m)  Weight:    124.512 kg (274 lb 8 oz)  SpO2: 97% 98% 95% 100%   Blood pressure 148/93, pulse 90, temperature 97.6 F (36.4 C), temperature source Oral, resp. rate 19, height 5\' 9"  (1.753 m), weight 124.512 kg (274 lb 8 oz), SpO2 100.00%. PSYCH: SHe is alert and oriented x4; does not appear anxious does not appear depressed; affect is normal HEENT: Normocephalic and Atraumatic, Mucous membranes pink; PERRLA; EOM intact; Fundi:  Benign;  No scleral icterus, Nares: Patent, Oropharynx: Clear, Fair Dentition, Neck:  FROM, no cervical lymphadenopathy nor thyromegaly or carotid bruit; no JVD; Breasts:: Not examined CHEST WALL: No tenderness CHEST: Normal respiration, clear to auscultation bilaterally HEART: Regular rate and rhythm; no murmurs rubs or gallops BACK: No kyphosis or scoliosis; no CVA tenderness ABDOMEN: Positive Bowel Sounds, Scaphoid, Obese, soft non-tender; no masses, no organomegaly, no pannus; no intertriginous candida. Rectal Exam: Not done EXTREMITIES: No cyanosis, clubbing or 2+ BLE EDEMA, no ulcerations. Genitalia: not examined PULSES: 2+  and symmetric SKIN: Normal hydration no rash or ulceration CNS:  Alert x Oriented x 4, No Focal Deficits Vascular: pulses palpable throughout    Labs on Admission:  Basic Metabolic Panel:  Recent Labs Lab 02/27/14 1639  NA 146  K 3.6*  CL 106  GLUCOSE 143*  BUN 9  CREATININE 1.10   Liver Function Tests: No results found for this basename: AST, ALT, ALKPHOS, BILITOT, PROT, ALBUMIN,  in the last 168 hours No results found for this basename: LIPASE, AMYLASE,  in the last 168 hours No results found for this basename: AMMONIA,  in the last 168 hours CBC:  Recent Labs Lab 02/27/14 1452 02/27/14 1639  WBC 10.0  --   HGB 11.2* 12.9  HCT 34.8* 38.0  MCV 84.5  --   PLT 228  --    Cardiac Enzymes: No results found for this basename:  CKTOTAL, CKMB, CKMBINDEX, TROPONINI,  in the last 168 hours  BNP (last 3 results)  Recent Labs  08/22/13 1336 02/27/14 1452  PROBNP 13.3 36.9   CBG:  Recent Labs Lab 02/27/14 1646 02/27/14 2105 02/27/14 2151  GLUCAP 116* 72 122*    Radiological Exams on Admission: Ct Angio Chest Pe W/cm &/or Wo Cm  02/27/2014   CLINICAL DATA:  Left upper chest pain, elevated D-dimer  EXAM: CT ANGIOGRAPHY CHEST WITH CONTRAST  TECHNIQUE: Multidetector CT imaging of the chest was performed using the standard protocol during bolus administration of intravenous contrast. Multiplanar CT image reconstructions and MIPs were obtained to evaluate the vascular anatomy.  CONTRAST:  8 ml Omnipaque 350, followed by OMNIPAQUE IOHEXOL 350 MG/ML SOLN  COMPARISON:  DG CHEST 1V PORT dated 02/27/2014; DG CHEST 2 VIEW dated 08/22/2013  FINDINGS: The study was repeated due to poor opacification of the pulmonary arterial system. The second set of images also suffers from suboptimal opacification of the pulmonary arterial system. This is related to patient body habitus.  There is bilateral atelectatic change. No pleural or pericardial effusion. No evidence of pneumonia or  adenopathy.  The central pulmonary arteries are normal. Evaluation of the more peripheral vessels is very difficult and quite limited. However, several peripheral arterial branches supplying the posterior and posterior medial left lung base show evidence of relative under opacification even when compared to neighboring vessels. There are no other abnormalities. There are no acute musculoskeletal findings. Scans through the upper abdomen are unremarkable.  Review of the MIP images confirms the above findings.  IMPRESSION: Moderately limited study, with evidence to suggest small peripheral emboli involving distal pulmonary arteries supplying the left medial lung base.   Electronically Signed   By: Esperanza Heir M.D.   On: 02/27/2014 19:52   Dg Chest Port 1 View  02/27/2014   CLINICAL DATA:  Chest pain.  EXAM: PORTABLE CHEST - 1 VIEW  COMPARISON:  08/22/2013  FINDINGS: Cardiac silhouette appears mildly enlarged, accentuated by portable AP technique and degree of inspiration. Lungs are less well inflated than on the prior study with mild vascular crowding but without evidence of airspace consolidation, overt edema, pleural effusion, or pneumothorax. No acute osseous abnormality is identified.  IMPRESSION: Shallower lung inflation without evidence of acute cardiopulmonary process.   Electronically Signed   By: Sebastian Ache   On: 02/27/2014 16:17      EKG: Ordered     Assessment/Plan:   54 y.o. female with  Principal Problem:   Pulmonary embolism Active Problems:   Type II or unspecified type diabetes mellitus without mention of complication, not stated as uncontrolled   Essential hypertension, benign   COPD (chronic obstructive pulmonary disease)   Back pain, chronic     1.   Pulmonary Embolism-  On CTA Chest, and +D_dimer-= 1.30,   Admitted to Telemetry bed for monitoring,  Cycle Troponins,  EKG ordered,  And placed on Full dose Lovenox ,  Start coumadin Rx or newer Agents. Will need education.      Venous Duplex of BLEs ordered to eval for possible Lower Ext Source.     2.   DM2-  Continue Lantus Insulin,  SSI coverage PRN.   Check HbA1c.    3.   HTN- continue Lisinopril /HCTZ, monitor BPs.    4.  COPD continue Fluticasone Inhaler Daily.     5.   Chronic Back pain- continue Oxycodoen 15 mg As per home regimen.  Code Status:   FULL CODE Family Communication:    Family at bedside Disposition Plan:  Inpatient       Time spent:  3260 Minutes  Esperanza Madrazo Velora HecklerC Dawsen Krieger [facility] Pager [phone]  If 7PM-7AM, please contact night-coverage [email or web address] Password [email or web address] 02/27/2014, 10:06 PM

## 2014-02-28 DIAGNOSIS — J209 Acute bronchitis, unspecified: Secondary | ICD-10-CM

## 2014-02-28 DIAGNOSIS — Z6841 Body Mass Index (BMI) 40.0 and over, adult: Secondary | ICD-10-CM

## 2014-02-28 DIAGNOSIS — R609 Edema, unspecified: Secondary | ICD-10-CM

## 2014-02-28 DIAGNOSIS — J019 Acute sinusitis, unspecified: Secondary | ICD-10-CM

## 2014-02-28 LAB — GLUCOSE, CAPILLARY
GLUCOSE-CAPILLARY: 113 mg/dL — AB (ref 70–99)
GLUCOSE-CAPILLARY: 127 mg/dL — AB (ref 70–99)
Glucose-Capillary: 109 mg/dL — ABNORMAL HIGH (ref 70–99)
Glucose-Capillary: 204 mg/dL — ABNORMAL HIGH (ref 70–99)
Glucose-Capillary: 143 mg/dL — ABNORMAL HIGH (ref 70–99)

## 2014-02-28 LAB — CBC
HCT: 34.5 % — ABNORMAL LOW (ref 36.0–46.0)
Hemoglobin: 10.8 g/dL — ABNORMAL LOW (ref 12.0–15.0)
MCH: 27.1 pg (ref 26.0–34.0)
MCHC: 31.3 g/dL (ref 30.0–36.0)
MCV: 86.5 fL (ref 78.0–100.0)
Platelets: 219 K/uL (ref 150–400)
RBC: 3.99 MIL/uL (ref 3.87–5.11)
RDW: 15.8 % — ABNORMAL HIGH (ref 11.5–15.5)
WBC: 7.7 K/uL (ref 4.0–10.5)

## 2014-02-28 LAB — HEMOGLOBIN A1C
Hgb A1c MFr Bld: 7.8 % — ABNORMAL HIGH (ref <5.7)
Mean Plasma Glucose: 177 mg/dL — ABNORMAL HIGH (ref <117)

## 2014-02-28 LAB — TROPONIN I
Troponin I: <0.3 ng/mL (ref <0.30)
Troponin I: <0.3 ng/mL (ref <0.30)

## 2014-02-28 LAB — BASIC METABOLIC PANEL
BUN: 9 mg/dL (ref 6–23)
CO2: 27 mEq/L (ref 19–32)
Calcium: 9.5 mg/dL (ref 8.4–10.5)
Chloride: 109 mEq/L (ref 96–112)
Creatinine, Ser: 0.81 mg/dL (ref 0.50–1.10)
GFR calc non Af Amer: 83 mL/min — ABNORMAL LOW (ref >90)
Glucose, Bld: 107 mg/dL — ABNORMAL HIGH (ref 70–99)
Potassium: 3.9 mEq/L (ref 3.7–5.3)
Sodium: 144 mEq/L (ref 137–147)

## 2014-02-28 MED ORDER — FLUTICASONE PROPIONATE 50 MCG/ACT NA SUSP
2.0000 | Freq: Every day | NASAL | Status: DC
  Administered 2014-02-28 – 2014-03-01 (×2): 2 via NASAL
  Filled 2014-02-28: qty 16

## 2014-02-28 MED ORDER — RIVAROXABAN 15 MG PO TABS
15.0000 mg | ORAL_TABLET | Freq: Two times a day (BID) | ORAL | Status: DC
  Filled 2014-02-28 (×2): qty 1

## 2014-02-28 MED ORDER — RIVAROXABAN 15 MG PO TABS
15.0000 mg | ORAL_TABLET | Freq: Two times a day (BID) | ORAL | Status: DC
  Administered 2014-02-28 – 2014-03-01 (×2): 15 mg via ORAL
  Filled 2014-02-28 (×4): qty 1

## 2014-02-28 MED ORDER — DM-GUAIFENESIN ER 30-600 MG PO TB12
1.0000 | ORAL_TABLET | Freq: Two times a day (BID) | ORAL | Status: DC
  Administered 2014-02-28 – 2014-03-01 (×3): 1 via ORAL
  Filled 2014-02-28 (×4): qty 1

## 2014-02-28 MED ORDER — DOXYCYCLINE HYCLATE 100 MG PO TABS
100.0000 mg | ORAL_TABLET | Freq: Two times a day (BID) | ORAL | Status: DC
  Administered 2014-02-28 – 2014-03-01 (×3): 100 mg via ORAL
  Filled 2014-02-28 (×4): qty 1

## 2014-02-28 NOTE — Progress Notes (Signed)
VASCULAR LAB PRELIMINARY  PRELIMINARY  PRELIMINARY  PRELIMINARY  Bilateral lower extremity venous Dopplers completed.    Preliminary report:  There is no DVT or SVT noted in the bilateral lower extremities.  Kern AlbertaCandace R Kayleah Appleyard, RVT 02/28/2014, 11:52 AM

## 2014-02-28 NOTE — Progress Notes (Signed)
Chart reviewed.   [facility] PROGRESS NOTE  Oyindamola Simmie Davies Egger ZOX:[id number] DOB: 1961-04-01 DOA: 02/27/2014 PCP: Charlette CaffeyParuchuri, Lakshmi P, MD  Assessment/Plan:  Principal Problem:   Pulmonary embolism:  Have asked care management to look [facility] novel anticoagulants. Continue oxygen and telemetry. Continue Lovenox. Activity as tolerated. Start diet. Doppler legs pending, though wouldn't change management. Suspect benign procedure may have precipitated this. She also reports her father may have had a blood clot in his leg, but she will confirm. Active Problems:   Type II or unspecified type diabetes mellitus without mention of complication, not stated as uncontrolled. Continue current.   Essential hypertension, benign   COPD (chronic obstructive pulmonary disease)   Back pain, chronic   Acute sinusitis: Has had frontal and maxillary sinus pressure, postnasal drip and cough for about a month. We'll start flonase and doxycycline. Reports an allergy to Ceftin.   Acute bronchitis: See above. Antitussives as needed.   Obesity, unspecified  Code Status:  full Family Communication:   Disposition Plan:  home   HPI/Subjective:  feels fatigued. Did not sleep well. Was coughing and short of breath. Has had a cough for a month. Frontal and maxillary sinus pressure with tooth pain and postnasal drip. Also a "cramp" in her chest. No fevers or chills. Had a gynecologic procedure by Dr. Chilton SiGreen a week ago for for what sounds like dysfunctional uterine bleeding. Postprocedure, decreased activity level for about a week. No previous history of blood clot or cancer. Thanks father may have had a blood clot in his leg but unsure.   Objective: Filed Vitals:   02/28/14 0536  BP: 118/65  Pulse: 85  Temp: 97.9 F (36.6 C)  Resp: 18    Intake/Output Summary (Last 24 hours) at 02/28/14 0810 Last data filed at 02/28/14 0547  Gross per 24 hour  Intake      0 ml  Output     850 ml  Net   -850 ml   Filed Weights   02/27/14 2134  Weight: 169.872 kg (374 lb 8 oz)    Exam:   General:   obese. Comfortable. Breathing nonlabored.   HEENT: Frontal and maxillary sinus tenderness bilaterally   Cardiovascular:  regular rate and rhythm without murmurs gallops rubs   Chest wall tenderness present   Respiratory:  clear to auscultation bilaterally without wheezes rhonchi or rales   Abdomen:  obese soft nontender nondistended   Ext:  No pitting. No calf tenderness.  Basic Metabolic Panel:  Recent Labs Lab 02/27/14 1639 02/28/14 0329  NA 146 144  K 3.6* 3.9  CL 106 109  CO2  --  27  GLUCOSE 143* 107*  BUN 9 9  CREATININE 1.10 0.81  CALCIUM  --  9.5   Liver Function Tests: No results found for this basename: AST, ALT, ALKPHOS, BILITOT, PROT, ALBUMIN,  in the last 168 hours No results found for this basename: LIPASE, AMYLASE,  in the last 168 hours No results found for this basename: AMMONIA,  in the last 168 hours CBC:  Recent Labs Lab 02/27/14 1452 02/27/14 1639 02/28/14 0329  WBC 10.0  --  7.7  HGB 11.2* 12.9 10.8*  HCT 34.8* 38.0 34.5*  MCV 84.5  --  86.5  PLT 228  --  219   Cardiac Enzymes:  Recent Labs Lab 02/27/14 0004 02/28/14 0329  TROPONINI <0.30 <0.30   BNP (last 3 results)  Recent Labs  08/22/13 1336 02/27/14 1452  PROBNP  13.3 36.9   CBG:  Recent Labs Lab 02/27/14 1646 02/27/14 2105 02/27/14 2151 02/28/14 0044 02/28/14 0639  GLUCAP 116* 72 122* 113* 109*    No results found for this or any previous visit (from the past 240 hour(s)).   Studies: Ct Angio Chest Pe W/cm &/or Wo Cm  02/27/2014   CLINICAL DATA:  Left upper chest pain, elevated D-dimer  EXAM: CT ANGIOGRAPHY CHEST WITH CONTRAST  TECHNIQUE: Multidetector CT imaging of the chest was performed using the standard protocol during bolus administration of intravenous contrast. Multiplanar CT image reconstructions and MIPs were obtained to evaluate the  vascular anatomy.  CONTRAST:  8 ml Omnipaque 350, followed by 100mL OMNIPAQUE IOHEXOL 350 MG/ML SOLN  COMPARISON:  DG CHEST 1V PORT dated 02/27/2014; DG CHEST 2 VIEW dated 08/22/2013  FINDINGS: The study was repeated due to poor opacification of the pulmonary arterial system. The second set of images also suffers from suboptimal opacification of the pulmonary arterial system. This is related to patient body habitus.  There is bilateral atelectatic change. No pleural or pericardial effusion. No evidence of pneumonia or adenopathy.  The central pulmonary arteries are normal. Evaluation of the more peripheral vessels is very difficult and quite limited. However, several peripheral arterial branches supplying the posterior and posterior medial left lung base show evidence of relative under opacification even when compared to neighboring vessels. There are no other abnormalities. There are no acute musculoskeletal findings. Scans through the upper abdomen are unremarkable.  Review of the MIP images confirms the above findings.  IMPRESSION: Moderately limited study, with evidence to suggest small peripheral emboli involving distal pulmonary arteries supplying the left medial lung base.   Electronically Signed   By: Esperanza Heiraymond  Rubner M.D.   On: 02/27/2014 19:52   Dg Chest Port 1 View  02/27/2014   CLINICAL DATA:  Chest pain.  EXAM: PORTABLE CHEST - 1 VIEW  COMPARISON:  08/22/2013  FINDINGS: Cardiac silhouette appears mildly enlarged, accentuated by portable AP technique and degree of inspiration. Lungs are less well inflated than on the prior study with mild vascular crowding but without evidence of airspace consolidation, overt edema, pleural effusion, or pneumothorax. No acute osseous abnormality is identified.  IMPRESSION: Shallower lung inflation without evidence of acute cardiopulmonary process.   Electronically Signed   By: Sebastian AcheAllen  Grady   On: 02/27/2014 16:17    Scheduled Meds: . enoxaparin (LOVENOX) injection  170  mg Subcutaneous Q12H  . gabapentin  300 mg Oral BID  . gabapentin  600 mg Oral QHS  . lisinopril  20 mg Oral Daily   And  . hydrochlorothiazide  25 mg Oral Daily  . insulin aspart  0-5 Units Subcutaneous QHS  . insulin aspart  0-9 Units Subcutaneous TID WC  . insulin glargine  100 Units Subcutaneous QHS  . oxybutynin  5 mg Oral q morning - 10a  . sodium chloride  3 mL Intravenous Q12H  . sodium chloride  3 mL Intravenous Q12H  . valACYclovir  500 mg Oral BID  . [START ON 03/01/2014] Vitamin D (Ergocalciferol)  50,000 Units Oral Q7 days   Continuous Infusions:   Time spent: 35 minutes  Christiane Haorinna L Abraham Entwistle  [facility] Pager [phone]. If 7PM-7AM, please contact night-coverage at [email or web address], password [email or web address] 02/28/2014, 8:10 AM  LOS: 1 day

## 2014-02-28 NOTE — Progress Notes (Signed)
Pt and her sister were concerned that there is a history of lupus and wanted to know if she had any lab work drawn for this.  I said that I would pass along but did not think this would change treatment for PE. Pt also concerned that every time she gets up to walk at home her feet start to swell. Pt resting with call bell within reach.  Will continue to monitor. Thomas HoffSandra McClintock Ulonda Klosowski, RN

## 2014-02-28 NOTE — Care Management Note (Addendum)
    Page 1 of 1   02/28/2014     9:33:23 AM CARE MANAGEMENT NOTE 02/28/2014  Patient:  Kirsten Gonzalez, Kirsten Gonzalez   Account Number:  [id number]  Date Initiated:  02/28/2014  Documentation initiated by:  [facility]  Subjective/Objective Assessment:   adm: Chest Pain and SOB     Action/Plan:   discharge planning   Anticipated DC Date:  02/28/2014   Anticipated DC Plan:  [place]  Medication Assistance  CM consult      Choice offered to / List presented to:             Status of service:  Completed, signed off Medicare Important Message given?  YES (If response is "NO", the following Medicare IM given date fields will be blank) Date Medicare IM given:  02/28/2014 Date Additional Medicare IM given:    Discharge Disposition:  HOME/SELF CARE  Per UR Regulation:    If discussed at Long Length of Stay Meetings, dates discussed:    Comments:  02/28/14/09:29 EPIC does not reflect IM which pt states was given to her in the ED; this CM had pt sign and pt verbalized understanding of IM from Medicare; copy given to pt and original placed in shadow chart.  Mariane Masters, BSN, CM [phone]. 09:00 CM met with pt in room and activated her free 30 day trial card for Xarelto.  Though unable to confirm with [employer] as to coverage (bc of weekend), pt has just received Gonzalez med coverage book from her insurance and book states Xarelto is covered.  Pt verbalized understanding the 30 day free supply will give her enough time to have med pre-authorized at her follow up visit by the office staff.  No other CM needs were communicated. Mariane Masters, BSN, CM [phone].

## 2014-03-01 LAB — GLUCOSE, CAPILLARY
GLUCOSE-CAPILLARY: 132 mg/dL — AB (ref 70–99)
GLUCOSE-CAPILLARY: 143 mg/dL — AB (ref 70–99)

## 2014-03-01 MED ORDER — OXYCODONE HCL 5 MG PO TABS: 5.0000 mg | ORAL_TABLET | ORAL | Status: DC | PRN

## 2014-03-01 MED ORDER — DOXYCYCLINE HYCLATE 100 MG PO TABS
100.0000 mg | ORAL_TABLET | Freq: Two times a day (BID) | ORAL | Status: DC
Start: 2014-03-01 — End: 2014-04-24

## 2014-03-01 MED ORDER — RIVAROXABAN 15 MG PO TABS: 15.0000 mg | ORAL_TABLET | Freq: Two times a day (BID) | ORAL | Status: DC

## 2014-03-01 MED ORDER — RIVAROXABAN 20 MG PO TABS: 20.0000 mg | ORAL_TABLET | Freq: Every day | ORAL | Status: DC

## 2014-03-01 MED ORDER — FLUTICASONE PROPIONATE 50 MCG/ACT NA SUSP: 2.0000 | Freq: Every day | NASAL | Status: DC

## 2014-03-01 NOTE — Discharge Summary (Signed)
Physician Discharge Summary  Kirsten Gonzalez NGE:[id number] DOB: 1961/10/12 DOA: 02/27/2014  PCP: Charlette CaffeyParuchuri, Kirsten P, MD  Admit date: 02/27/2014 Discharge date: 03/01/2014  Time spent: greater than 30 minutes  Recommendations for Outpatient Follow-up:  1.   Discharge Diagnoses:  Principal Problem:   Pulmonary embolism Active Problems:   Type II or unspecified type diabetes mellitus without mention of complication, not stated as uncontrolled   Essential hypertension, benign   COPD (chronic obstructive pulmonary disease)   Back pain, chronic   Acute sinusitis   Acute bronchitis   Obesity, unspecified   Discharge Condition: stable  Filed Weights   02/27/14 2134  Weight: 169.872 kg (374 lb 8 oz)    History of present illness:  53 y.o. female with a history of DM2, HTN and Copd who egan to have pleuritic chest pain after she had finished mowing her lawn. She reports going into her house to take a break and she suddening had pain when she took a deep breath and she report the pain was in the left side and rated the pain at a 10/10. EMS was called and she was given a SL NTG which partially improved the pain but the pain was not relieved until she had been given a total ot 4 SL NTG tablets. She was evaluated in the ED and found to have an elevated D-Dimer at 1.30. The CTA of Chest revealed findings suggestive of a distal peripheral left lung pulmonary embolism.  Had a gynecologic procedure a week prior to admission with postprocedure reduction in ambulation/mobility  Also complained of cough, sinus congestion and post nasal drip for a month  Hospital Course:  Started on xarelto for PE.  Felt secondary to recent procedure. Will need 6 months of anticoagulation.  No previous h/o thromboembolism.  Started on augmentin, flonase for bronchitis/sinusitis. By discharge, able to ambulated, stable vital signs and feeling better  Procedures:  none  Consultations:  none  Discharge  Exam: Filed Vitals:   03/01/14 0455  BP: 135/80  Pulse: 80  Temp:   Resp: 18    General: comfortable. ambulating Cardiovascular: RRR Respiratory: CTA Ext no CCE  Discharge Orders   Future Orders Complete By Expires   Diet - low sodium heart healthy  As directed    Diet Carb Modified  As directed    Increase activity slowly  As directed        Medication List         albuterol-ipratropium 18-103 MCG/ACT inhaler  Commonly known as:  COMBIVENT  Inhale 2 puffs into the lungs every 6 (six) hours as needed for wheezing.     doxycycline 100 MG tablet  Commonly known as:  VIBRA-TABS  Take 1 tablet (100 mg total) by mouth every 12 (twelve) hours.     ergocalciferol 50000 UNITS capsule  Commonly known as:  VITAMIN D2  Take 50,000 Units by mouth every Sunday.     fluticasone 50 MCG/ACT nasal spray  Commonly known as:  FLONASE  Place 2 sprays into both nostrils daily.     gabapentin 600 MG tablet  Commonly known as:  NEURONTIN  Take 300-600 mg by mouth 3 (three) times daily. 1/2 tablet (300 mg) every morning and afternoon, 1 tablet (600 mg) at bedtime     LANTUS SOLOSTAR 100 UNIT/ML Solostar Pen  Generic drug:  Insulin Glargine  Inject 100 Units into the skin daily at 10 pm.     lisinopril-hydrochlorothiazide 20-25 MG per tablet  Commonly known as:  PRINZIDE,ZESTORETIC  Take 1 tablet by mouth daily.     NOVOLOG FLEXPEN 100 UNIT/ML FlexPen  Generic drug:  insulin aspart  Inject 10-20 Units into the skin 3 (three) times daily with meals. Per sliding scale     oxybutynin 5 MG 24 hr tablet  Commonly known as:  DITROPAN-XL  Take 5 mg by mouth every morning.     oxyCODONE 5 MG immediate release tablet  Commonly known as:  ROXICODONE  Take 1-2 tablets (5-10 mg total) by mouth every 4 (four) hours as needed for severe pain.     Rivaroxaban 15 MG Tabs tablet  Commonly known as:  XARELTO  Take 1 tablet (15 mg total) by mouth 2 (two) times daily with a meal. For 3 weeks,  then 20 mg daily (see other prescription)     rivaroxaban 20 MG Tabs tablet  Commonly known as:  XARELTO  Take 1 tablet (20 mg total) by mouth daily with supper.     valACYclovir 500 MG tablet  Commonly known as:  VALTREX  Take 500 mg by mouth 2 (two) times daily.       Allergies  Allergen Reactions  . Ceftin [Cefuroxime Axetil] Shortness Of Breath, Nausea And Vomiting and Rash  . Celebrex [Celecoxib] Other (See Comments)    Involuntary leg twitching and leg pain  . Coconut Oil Nausea Only  . Hydrocodone Nausea And Vomiting    Severe vomiting  . Mushroom Extract Complex Nausea And Vomiting       Follow-up Information   Follow up with Gonzalez, Kirsten HoardLakshmi P, MD. (as previously scheduled)    Specialty:  Internal Medicine   Contact information:   [address] [place] [address] [phone]        The results of significant diagnostics from this hospitalization (including imaging, microbiology, ancillary and laboratory) are listed below for reference.    Significant Diagnostic Studies: Dg Ankle Complete Left  02/17/2014   X-ray examination left ankle  Intact bony structure without fracture or dislocation noted. Large fleshy  lower extremity noted. The ankle mortise is intact. Pes planus noted.  Radiographic impression: no acute bony abnormality noted  Dg Ankle Complete Right  02/17/2014   X-ray report right ankle  Intact bony structure without fracture or dislocation noted. Large fleshy  lower extremity noted. The ankle mortise is intact. Pes planus noted.  Inferior calcaneal spur noted.  Radiographic impression: No acute bony abnormality noted  Ct Angio Chest Pe W/cm &/or Wo Cm  02/27/2014   CLINICAL DATA:  Left upper chest pain, elevated D-dimer  EXAM: CT ANGIOGRAPHY CHEST WITH CONTRAST  TECHNIQUE: Multidetector CT imaging of the chest was performed using the standard protocol during bolus administration of intravenous contrast. Multiplanar CT image reconstructions and  MIPs were obtained to evaluate the vascular anatomy.  CONTRAST:  8 ml Omnipaque 350, followed by 100mL OMNIPAQUE IOHEXOL 350 MG/ML SOLN  COMPARISON:  DG CHEST 1V PORT dated 02/27/2014; DG CHEST 2 VIEW dated 08/22/2013  FINDINGS: The study was repeated due to poor opacification of the pulmonary arterial system. The second set of images also suffers from suboptimal opacification of the pulmonary arterial system. This is related to patient body habitus.  There is bilateral atelectatic change. No pleural or pericardial effusion. No evidence of pneumonia or adenopathy.  The central pulmonary arteries are normal. Evaluation of the more peripheral vessels is very difficult and quite limited. However, several peripheral arterial branches supplying the posterior and posterior medial left lung base show  evidence of relative under opacification even when compared to neighboring vessels. There are no other abnormalities. There are no acute musculoskeletal findings. Scans through the upper abdomen are unremarkable.  Review of the MIP images confirms the above findings.  IMPRESSION: Moderately limited study, with evidence to suggest small peripheral emboli involving distal pulmonary arteries supplying the left medial lung base.   Electronically Signed   By: Esperanza Heir M.D.   On: 02/27/2014 19:52   Dg Chest Port 1 View  02/27/2014   CLINICAL DATA:  Chest pain.  EXAM: PORTABLE CHEST - 1 VIEW  COMPARISON:  08/22/2013  FINDINGS: Cardiac silhouette appears mildly enlarged, accentuated by portable AP technique and degree of inspiration. Lungs are less well inflated than on the prior study with mild vascular crowding but without evidence of airspace consolidation, overt edema, pleural effusion, or pneumothorax. No acute osseous abnormality is identified.  IMPRESSION: Shallower lung inflation without evidence of acute cardiopulmonary process.   Electronically Signed   By: Sebastian Ache   On: 02/27/2014 16:17   Dg Foot Complete  Left  02/17/2014   X-ray examination left foot  Intact bony structure without fracture or dislocation noted. Large fleshy  lower extremity noted. Inferior calcaneal spur noted. Advanced HAV  deformity noted. Pes planus.  Radiographic compression: No acute bony abnormality noted left foot.  Dg Foot Complete Right  02/17/2014   X-ray examination right foot  Intact bony structure without fracture or dislocation noted. Large fleshy  lower extremity noted. Inferior calcaneal spur noted. Advanced HAV  deformity noted. Pes planus.  Radiographic impression: No acute bony abnormality noted right foot.  Doppler legs negative for DVT  Microbiology: No results found for this or any previous visit (from the past 240 hour(s)).   Labs: Basic Metabolic Panel:  Recent Labs Lab 02/27/14 1639 02/28/14 0329  NA 146 144  K 3.6* 3.9  CL 106 109  CO2  --  27  GLUCOSE 143* 107*  BUN 9 9  CREATININE 1.10 0.81  CALCIUM  --  9.5   Liver Function Tests: No results found for this basename: AST, ALT, ALKPHOS, BILITOT, PROT, ALBUMIN,  in the last 168 hours No results found for this basename: LIPASE, AMYLASE,  in the last 168 hours No results found for this basename: AMMONIA,  in the last 168 hours CBC:  Recent Labs Lab 02/27/14 1452 02/27/14 1639 02/28/14 0329  WBC 10.0  --  7.7  HGB 11.2* 12.9 10.8*  HCT 34.8* 38.0 34.5*  MCV 84.5  --  86.5  PLT 228  --  219   Cardiac Enzymes:  Recent Labs Lab 02/27/14 0004 02/28/14 0329  TROPONINI <0.30 <0.30   BNP: BNP (last 3 results)  Recent Labs  08/22/13 1336 02/27/14 1452  PROBNP 13.3 36.9   CBG:  Recent Labs Lab 02/28/14 0044 02/28/14 0639 02/28/14 1119 02/28/14 1532 02/28/14 2112  GLUCAP 113* 109* 204* 127* 143*    Signed:  Christiane Ha, MD [facility] 03/01/2014, 10:42 AM

## 2014-03-01 NOTE — Progress Notes (Signed)
Pt d/c home.   A&O x4.  No c/o pain.  IV D/Cd.  Education given on diet, activity, meds, and follow-up care and appts.  Pt given xarelto assistance pack.  Pt verbalized understanding.  Taken home via private vehicle.

## 2014-03-02 NOTE — Care Management (Signed)
Recieved consult for medication assistance.  patient has Medicaid.  Effient is on Medicaid formulary for 3 dollars. Other meds are also for 3 dollars. Kirsten BickerSarah Jane Taim Gonzalez [phone] 03/02/2014

## 2014-03-10 ENCOUNTER — Encounter (HOSPITAL_COMMUNITY): Payer: Self-pay | Admitting: Emergency Medicine

## 2014-03-10 ENCOUNTER — Other Ambulatory Visit: Payer: Self-pay

## 2014-03-10 DIAGNOSIS — E669 Obesity, unspecified: Secondary | ICD-10-CM | POA: Insufficient documentation

## 2014-03-10 DIAGNOSIS — Z87891 Personal history of nicotine dependence: Secondary | ICD-10-CM | POA: Insufficient documentation

## 2014-03-10 DIAGNOSIS — E119 Type 2 diabetes mellitus without complications: Secondary | ICD-10-CM | POA: Insufficient documentation

## 2014-03-10 DIAGNOSIS — IMO0002 Reserved for concepts with insufficient information to code with codable children: Secondary | ICD-10-CM | POA: Insufficient documentation

## 2014-03-10 DIAGNOSIS — Z79899 Other long term (current) drug therapy: Secondary | ICD-10-CM | POA: Insufficient documentation

## 2014-03-10 DIAGNOSIS — G8929 Other chronic pain: Secondary | ICD-10-CM | POA: Insufficient documentation

## 2014-03-10 DIAGNOSIS — J4489 Other specified chronic obstructive pulmonary disease: Secondary | ICD-10-CM | POA: Insufficient documentation

## 2014-03-10 DIAGNOSIS — Z794 Long term (current) use of insulin: Secondary | ICD-10-CM | POA: Insufficient documentation

## 2014-03-10 DIAGNOSIS — Z7901 Long term (current) use of anticoagulants: Secondary | ICD-10-CM | POA: Insufficient documentation

## 2014-03-10 DIAGNOSIS — I10 Essential (primary) hypertension: Secondary | ICD-10-CM | POA: Insufficient documentation

## 2014-03-10 DIAGNOSIS — M545 Low back pain, unspecified: Secondary | ICD-10-CM | POA: Insufficient documentation

## 2014-03-10 DIAGNOSIS — J449 Chronic obstructive pulmonary disease, unspecified: Secondary | ICD-10-CM | POA: Insufficient documentation

## 2014-03-10 DIAGNOSIS — R209 Unspecified disturbances of skin sensation: Secondary | ICD-10-CM | POA: Insufficient documentation

## 2014-03-10 LAB — COMPREHENSIVE METABOLIC PANEL
ALK PHOS: 67 U/L (ref 39–117)
ALT: 23 U/L (ref 0–35)
AST: 19 U/L (ref 0–37)
Albumin: 3.7 g/dL (ref 3.5–5.2)
BUN: 13 mg/dL (ref 6–23)
CALCIUM: 10.6 mg/dL — AB (ref 8.4–10.5)
CO2: 25 mEq/L (ref 19–32)
Chloride: 103 mEq/L (ref 96–112)
Creatinine, Ser: 0.92 mg/dL (ref 0.50–1.10)
GFR calc non Af Amer: 71 mL/min — ABNORMAL LOW (ref >90)
GFR, EST AFRICAN AMERICAN: 83 mL/min — AB (ref >90)
GLUCOSE: 104 mg/dL — AB (ref 70–99)
POTASSIUM: 4.1 meq/L (ref 3.7–5.3)
SODIUM: 141 meq/L (ref 137–147)
TOTAL PROTEIN: 7.5 g/dL (ref 6.0–8.3)
Total Bilirubin: 0.2 mg/dL — ABNORMAL LOW (ref 0.3–1.2)

## 2014-03-10 LAB — CBC WITH DIFFERENTIAL/PLATELET
BASOS ABS: 0 10*3/uL (ref 0.0–0.1)
BASOS PCT: 0 % (ref 0–1)
EOS ABS: 0.1 10*3/uL (ref 0.0–0.7)
Eosinophils Relative: 1 % (ref 0–5)
HCT: 40.1 % (ref 36.0–46.0)
HEMOGLOBIN: 12.8 g/dL (ref 12.0–15.0)
Lymphocytes Relative: 44 % (ref 12–46)
Lymphs Abs: 4.9 10*3/uL — ABNORMAL HIGH (ref 0.7–4.0)
MCH: 27.4 pg (ref 26.0–34.0)
MCHC: 31.9 g/dL (ref 30.0–36.0)
MCV: 85.9 fL (ref 78.0–100.0)
MONO ABS: 1 10*3/uL (ref 0.1–1.0)
Monocytes Relative: 9 % (ref 3–12)
NEUTROS PCT: 46 % (ref 43–77)
Neutro Abs: 5.1 10*3/uL (ref 1.7–7.7)
Platelets: 293 10*3/uL (ref 150–400)
RBC: 4.67 MIL/uL (ref 3.87–5.11)
RDW: 15.6 % — ABNORMAL HIGH (ref 11.5–15.5)
WBC: 11.1 10*3/uL — ABNORMAL HIGH (ref 4.0–10.5)

## 2014-03-10 NOTE — ED Notes (Signed)
Pt. reports mid and low back pain onset today , denies injury or fall , pt. also stated tingling at right arm and right leg onset last night , respirations unlabored .

## 2014-03-11 ENCOUNTER — Emergency Department (HOSPITAL_COMMUNITY): Payer: Medicare HMO

## 2014-03-11 ENCOUNTER — Emergency Department (HOSPITAL_COMMUNITY)
Admission: EM | Admit: 2014-03-11 | Discharge: 2014-03-11 | Disposition: A | Discharge status: Home / Self Care | Payer: Medicare HMO | Attending: Emergency Medicine | Admitting: Emergency Medicine

## 2014-03-11 DIAGNOSIS — M545 Low back pain, unspecified: Secondary | ICD-10-CM

## 2014-03-11 LAB — URINALYSIS, ROUTINE W REFLEX MICROSCOPIC
BILIRUBIN URINE: NEGATIVE
GLUCOSE, UA: NEGATIVE mg/dL
Ketones, ur: NEGATIVE mg/dL
Leukocytes, UA: NEGATIVE
NITRITE: NEGATIVE
PH: 5 (ref 5.0–8.0)
Protein, ur: NEGATIVE mg/dL
SPECIFIC GRAVITY, URINE: 1.027 (ref 1.005–1.030)
Urobilinogen, UA: 0.2 mg/dL (ref 0.0–1.0)

## 2014-03-11 LAB — URINE MICROSCOPIC-ADD ON

## 2014-03-11 MED ORDER — CYCLOBENZAPRINE HCL 10 MG PO TABS: 10.0000 mg | ORAL_TABLET | Freq: Two times a day (BID) | ORAL | Status: DC | PRN

## 2014-03-11 MED ORDER — HYDROMORPHONE HCL PF 1 MG/ML IJ SOLN
1.0000 mg | Freq: Once | INTRAMUSCULAR | Status: AC
  Administered 2014-03-11: 1 mg via INTRAVENOUS
  Filled 2014-03-11: qty 1

## 2014-03-11 MED ORDER — CYCLOBENZAPRINE HCL 10 MG PO TABS
5.0000 mg | ORAL_TABLET | Freq: Once | ORAL | Status: AC
  Administered 2014-03-11: 5 mg via ORAL
  Filled 2014-03-11: qty 1

## 2014-03-11 MED ORDER — ONDANSETRON HCL 4 MG/2ML IJ SOLN
4.0000 mg | Freq: Once | INTRAMUSCULAR | Status: AC
  Administered 2014-03-11: 4 mg via INTRAVENOUS
  Filled 2014-03-11: qty 2

## 2014-03-11 NOTE — Discharge Instructions (Signed)
Back Pain, Adult Low back pain is very common. About 1 in 5 people have back pain.The cause of low back pain is rarely dangerous. The pain often gets better over time.About half of people with a sudden onset of back pain feel better in just 2 weeks. About 8 in 10 people feel better by 6 weeks.  CAUSES Some common causes of back pain include:  Strain of the muscles or ligaments supporting the spine.  Wear and tear (degeneration) of the spinal discs.  Arthritis.  Direct injury to the back. DIAGNOSIS Most of the time, the direct cause of low back pain is not known.However, back pain can be treated effectively even when the exact cause of the pain is unknown.Answering your caregiver's questions about your overall health and symptoms is one of the most accurate ways to make sure the cause of your pain is not dangerous. If your caregiver needs more information, he or she may order lab work or imaging tests (X-rays or MRIs).However, even if imaging tests show changes in your back, this usually does not require surgery. HOME CARE INSTRUCTIONS For many people, back pain returns.Since low back pain is rarely dangerous, it is often a condition that people can learn to manageon their own.   Remain active. It is stressful on the back to sit or stand in one place. Do not sit, drive, or stand in one place for more than 30 minutes at a time. Take short walks on level surfaces as soon as pain allows.Try to increase the length of time you walk each day.  Do not stay in bed.Resting more than 1 or 2 days can delay your recovery.  Do not avoid exercise or work.Your body is made to move.It is not dangerous to be active, even though your back may hurt.Your back will likely heal faster if you return to being active before your pain is gone.  Pay attention to your body when you bend and lift. Many people have less discomfortwhen lifting if they bend their knees, keep the load close to their bodies,and  avoid twisting. Often, the most comfortable positions are those that put less stress on your recovering back.  Find a comfortable position to sleep. Use a firm mattress and lie on your side with your knees slightly bent. If you lie on your back, put a pillow under your knees.  Only take over-the-counter or prescription medicines as directed by your caregiver. Over-the-counter medicines to reduce pain and inflammation are often the most helpful.Your caregiver may prescribe muscle relaxant drugs.These medicines help dull your pain so you can more quickly return to your normal activities and healthy exercise.  Put ice on the injured area.  Put ice in a plastic bag.  Place a towel between your skin and the bag.  Leave the ice on for 15-20 minutes, 03-04 times a day for the first 2 to 3 days. After that, ice and heat may be alternated to reduce pain and spasms.  Ask your caregiver about trying back exercises and gentle massage. This may be of some benefit.  Avoid feeling anxious or stressed.Stress increases muscle tension and can worsen back pain.It is important to recognize when you are anxious or stressed and learn ways to manage it.Exercise is a great option. SEEK MEDICAL CARE IF:  You have pain that is not relieved with rest or medicine.  You have pain that does not improve in 1 week.  You have new symptoms.  You are generally not feeling well. SEEK   IMMEDIATE MEDICAL CARE IF:   You have pain that radiates from your back into your legs.  You develop new bowel or bladder control problems.  You have unusual weakness or numbness in your arms or legs.  You develop nausea or vomiting.  You develop abdominal pain.  You feel faint. Document Released: 10/09/2005 Document Revised: 04/09/2012 Document Reviewed: 02/27/2011 Exit[employer] Patient Information 2014 [employer].  

## 2014-03-11 NOTE — ED Provider Notes (Signed)
CSN: [id number]     Arrival date & time 5/19161096045/15  2102 History   First MD Initiated Contact with Patient 03/11/14 0040     Chief Complaint  Patient presents with  . Back Pain     (Consider location/radiation/quality/duration/timing/severity/associated sxs/prior Treatment) HPI History provided by patient. History of chronic back pain, worse or lasts 3-4 hours. Pain is sharp in quality, worse with movement and radiates to her right leg. She has some associated numbness but denies any weakness. She normally walks with a cane. She is able walk with a cane tonight. She has chronic pain all over for which he takes OxyContin daily. No incontinence of bowel or bladder. No fevers or chills. No trauma.  Past Medical History  Diagnosis Date  . Obesity   . Diabetes mellitus without complication   . Hypertension   . Asthma   . COPD (chronic obstructive pulmonary disease)   . Back pain, chronic   . Hip pain, chronic    Past Surgical History  Procedure Laterality Date  . Breast reduction surgery    . Hernia repair    . Tubal ligation    . Ankle arthroscopy     No family history on file. History  Substance Use Topics  . Smoking status: Former Smoker    Types: Cigarettes, Cigars    Quit date: 02/28/2000  . Smokeless tobacco: Never Used  . Alcohol Use: No   OB History   Grav Para Term Preterm Abortions TAB SAB Ect Mult Living                 Review of Systems  Constitutional: Negative for fever and chills.  Eyes: Negative for pain.  Respiratory: Negative for shortness of breath.   Cardiovascular: Negative for chest pain.  Gastrointestinal: Negative for abdominal pain.  Genitourinary: Negative for dysuria and flank pain.  Musculoskeletal: Positive for back pain. Negative for neck pain and neck stiffness.  Skin: Negative for rash.  Neurological: Negative for weakness and headaches.  All other systems reviewed and are negative.     Allergies  Ceftin; Celebrex; Coconut oil;  Hydrocodone; and Mushroom extract complex  Home Medications   Prior to Admission medications   Medication Sig Start Date End Date Taking? Authorizing Provider  albuterol-ipratropium (COMBIVENT) 18-103 MCG/ACT inhaler Inhale 2 puffs into the lungs every 6 (six) hours as needed for wheezing. 08/22/13  Yes Rolland PorterMark James, MD  doxycycline (VIBRA-TABS) 100 MG tablet Take 1 tablet (100 mg total) by mouth every 12 (twelve) hours. 03/01/14  Yes Christiane Haorinna L Sullivan, MD  ergocalciferol (VITAMIN D2) 50000 UNITS capsule Take 50,000 Units by mouth every Sunday.   Yes Historical Provider, MD  fluticasone (FLONASE) 50 MCG/ACT nasal spray Place 2 sprays into both nostrils daily. 03/01/14  Yes Christiane Haorinna L Sullivan, MD  gabapentin (NEURONTIN) 600 MG tablet Take 300-600 mg by mouth 3 (three) times daily. 1/2 tablet (300 mg) every morning and afternoon, 1 tablet (600 mg) at bedtime   Yes Historical Provider, MD  insulin aspart (NOVOLOG FLEXPEN) 100 UNIT/ML SOPN FlexPen Inject 20-30 Units into the skin 3 (three) times daily with meals. Per sliding scale   Yes Historical Provider, MD  Insulin Glargine (LANTUS SOLOSTAR) 100 UNIT/ML SOPN Inject 100 Units into the skin daily at 10 pm.    Yes Historical Provider, MD  lisinopril-hydrochlorothiazide (PRINZIDE,ZESTORETIC) 20-25 MG per tablet Take 1 tablet by mouth daily.   Yes Historical Provider, MD  oxybutynin (DITROPAN-XL) 5 MG 24 hr tablet Take 5 mg by  mouth every morning.   Yes Historical Provider, MD  OxyCODONE (OXYCONTIN) 15 mg T12A 12 hr tablet Take 15 mg by mouth every 12 (twelve) hours.   Yes Historical Provider, MD  oxyCODONE (ROXICODONE) 5 MG immediate release tablet Take 1-2 tablets (5-10 mg total) by mouth every 4 (four) hours as needed for severe pain. 03/01/14  Yes Christiane Haorinna L Sullivan, MD  Rivaroxaban (XARELTO) 15 MG TABS tablet Take 1 tablet (15 mg total) by mouth 2 (two) times daily with a meal. For 3 weeks, then 20 mg daily (see other prescription) 03/01/14  Yes Christiane Haorinna  L Sullivan, MD  valACYclovir (VALTREX) 500 MG tablet Take 500 mg by mouth 2 (two) times daily.   Yes Historical Provider, MD  rivaroxaban (XARELTO) 20 MG TABS tablet Take 1 tablet (20 mg total) by mouth daily with supper. 03/01/14   Christiane Haorinna L Sullivan, MD   BP 114/71  Pulse 89  Temp(Src) 97.7 F (36.5 C) (Oral)  Resp 20  SpO2 97% Physical Exam  Constitutional: She is oriented to person, place, and time. She appears well-developed and well-nourished.  HENT:  Head: Normocephalic and atraumatic.  Eyes: EOM are normal. Pupils are equal, round, and reactive to light.  Neck: Neck supple.  Cardiovascular: Normal rate, regular rhythm and intact distal pulses.   Pulmonary/Chest: Effort normal and breath sounds normal. No respiratory distress.  Abdominal:  Obese abdomen, soft and nontender  Musculoskeletal: Normal range of motion. She exhibits no edema.  Tender over lower thoracic and upper lumbar spine and right paraspinal region. No obvious deformity. Equal DTRs, strengths and sensorium to light touch to bilateral lower extremities  Neurological: She is alert and oriented to person, place, and time.  Skin: Skin is warm and dry.    ED Course  Procedures (including critical care time) Labs Review Labs Reviewed  CBC WITH DIFFERENTIAL - Abnormal; Notable for the following:    WBC 11.1 (*)    RDW 15.6 (*)    Lymphs Abs 4.9 (*)    All other components within normal limits  COMPREHENSIVE METABOLIC PANEL - Abnormal; Notable for the following:    Glucose, Bld 104 (*)    Calcium 10.6 (*)    Total Bilirubin 0.2 (*)    GFR calc non Af Amer 71 (*)    GFR calc Af Amer 83 (*)    All other components within normal limits  URINALYSIS, ROUTINE W REFLEX MICROSCOPIC    Imaging Review Dg Thoracic Spine 2 View  03/11/2014   CLINICAL DATA:  BACK PAIN  EXAM: THORACIC SPINE - 2 VIEW  COMPARISON:  None.  FINDINGS: There is no evidence of thoracic spine fracture. There is dextroscoliosis within the  thoracic spine. No other significant bone abnormalities are identified.  IMPRESSION: Dextroscoliosis and thoracic spine.  No acute osseous abnormalities.   Electronically Signed   By: Salome HolmesHector  Cooper M.D.   On: 03/11/2014 03:26   Dg Lumbar Spine Complete  03/11/2014   CLINICAL DATA:  BACK PAIN  EXAM: LUMBAR SPINE - COMPLETE 4+ VIEW  COMPARISON:  None.  FINDINGS: There is no evidence of lumbar spine fracture. Mild levoscoliosis likely positional. Intervertebral disc spaces are maintained.  IMPRESSION: Negative.   Electronically Signed   By: Salome HolmesHector  Cooper M.D.   On: 03/11/2014 03:27     EKG Interpretation   Date/Time:  Tuesday Mar 10 2014 21:10:09 EDT Ventricular Rate:  98 PR Interval:  136 QRS Duration: 92 QT Interval:  354 QTC Calculation: 451 R Axis:  57 Text Interpretation:  Normal sinus rhythm Normal ECG No significant change  since last tracing Confirmed by Kleo Dungee  MD, Keoni Risinger ([id number]) on 03/11/2014  12:41:15 AM     IV Dilaudid Zofran provided. Ice and Flexeril provided  On recheck pain improved. Plan discharge home continue medications as prescribed followup with primary care physician. Back pain precautions verbalized is understood. MDM   Diagnosis: Acute on Chronic low back pain  Treated with IV narcotics. Evaluated with x-rays reviewed as above. Triage EKG and labs obtained and reviewed as above.  Vital signs and nursing notes reviewed and considered. No deficits or indication for emergent MRI at this time  Sunnie Nielsen, MD 03/11/14 0400

## 2014-03-11 NOTE — ED Notes (Signed)
Pt back from x-ray.

## 2014-04-24 ENCOUNTER — Emergency Department (HOSPITAL_COMMUNITY): Payer: Medicare HMO

## 2014-04-24 ENCOUNTER — Encounter (HOSPITAL_COMMUNITY): Payer: Self-pay | Admitting: Emergency Medicine

## 2014-04-24 ENCOUNTER — Observation Stay (HOSPITAL_COMMUNITY)
Admission: EM | Admit: 2014-04-24 | Discharge: 2014-04-25 | Disposition: A | Discharge status: Home / Self Care | Payer: Medicare HMO | Attending: Internal Medicine | Admitting: Internal Medicine

## 2014-04-24 DIAGNOSIS — I10 Essential (primary) hypertension: Secondary | ICD-10-CM | POA: Diagnosis not present

## 2014-04-24 DIAGNOSIS — Z885 Allergy status to narcotic agent status: Secondary | ICD-10-CM | POA: Insufficient documentation

## 2014-04-24 DIAGNOSIS — M79609 Pain in unspecified limb: Secondary | ICD-10-CM | POA: Diagnosis not present

## 2014-04-24 DIAGNOSIS — R079 Chest pain, unspecified: Secondary | ICD-10-CM | POA: Diagnosis present

## 2014-04-24 DIAGNOSIS — Z794 Long term (current) use of insulin: Secondary | ICD-10-CM | POA: Insufficient documentation

## 2014-04-24 DIAGNOSIS — Z79899 Other long term (current) drug therapy: Secondary | ICD-10-CM | POA: Diagnosis not present

## 2014-04-24 DIAGNOSIS — Z888 Allergy status to other drugs, medicaments and biological substances status: Secondary | ICD-10-CM | POA: Insufficient documentation

## 2014-04-24 DIAGNOSIS — Z791 Long term (current) use of non-steroidal anti-inflammatories (NSAID): Secondary | ICD-10-CM | POA: Diagnosis not present

## 2014-04-24 DIAGNOSIS — J441 Chronic obstructive pulmonary disease with (acute) exacerbation: Secondary | ICD-10-CM | POA: Insufficient documentation

## 2014-04-24 DIAGNOSIS — E669 Obesity, unspecified: Secondary | ICD-10-CM | POA: Insufficient documentation

## 2014-04-24 DIAGNOSIS — G8929 Other chronic pain: Secondary | ICD-10-CM | POA: Insufficient documentation

## 2014-04-24 DIAGNOSIS — Z7901 Long term (current) use of anticoagulants: Secondary | ICD-10-CM | POA: Diagnosis not present

## 2014-04-24 DIAGNOSIS — R0789 Other chest pain: Principal | ICD-10-CM | POA: Insufficient documentation

## 2014-04-24 DIAGNOSIS — I200 Unstable angina: Secondary | ICD-10-CM | POA: Diagnosis present

## 2014-04-24 DIAGNOSIS — J45901 Unspecified asthma with (acute) exacerbation: Secondary | ICD-10-CM

## 2014-04-24 DIAGNOSIS — E119 Type 2 diabetes mellitus without complications: Secondary | ICD-10-CM | POA: Diagnosis not present

## 2014-04-24 DIAGNOSIS — Z6841 Body Mass Index (BMI) 40.0 and over, adult: Secondary | ICD-10-CM

## 2014-04-24 DIAGNOSIS — Z86711 Personal history of pulmonary embolism: Secondary | ICD-10-CM | POA: Diagnosis present

## 2014-04-24 DIAGNOSIS — R42 Dizziness and giddiness: Secondary | ICD-10-CM | POA: Diagnosis not present

## 2014-04-24 DIAGNOSIS — Z87891 Personal history of nicotine dependence: Secondary | ICD-10-CM | POA: Diagnosis not present

## 2014-04-24 DIAGNOSIS — R209 Unspecified disturbances of skin sensation: Secondary | ICD-10-CM | POA: Diagnosis not present

## 2014-04-24 HISTORY — DX: Depression, unspecified: F32.A

## 2014-04-24 HISTORY — DX: Personal history of other medical treatment: Z92.89

## 2014-04-24 HISTORY — DX: Other pulmonary embolism without acute cor pulmonale: I26.99

## 2014-04-24 HISTORY — DX: Personal history of peptic ulcer disease: Z87.11

## 2014-04-24 HISTORY — DX: Type 2 diabetes mellitus without complications: E11.9

## 2014-04-24 HISTORY — DX: Bipolar disorder, unspecified: F31.9

## 2014-04-24 HISTORY — DX: Headache: R51

## 2014-04-24 HISTORY — DX: Personal history of other diseases of the digestive system: Z87.19

## 2014-04-24 HISTORY — DX: Low back pain: M54.5

## 2014-04-24 HISTORY — DX: Unspecified osteoarthritis, unspecified site: M19.90

## 2014-04-24 HISTORY — DX: Obstructive sleep apnea (adult) (pediatric): G47.33

## 2014-04-24 HISTORY — DX: Pneumonia, unspecified organism: J18.9

## 2014-04-24 HISTORY — DX: Low back pain, unspecified: M54.50

## 2014-04-24 HISTORY — DX: Anemia, unspecified: D64.9

## 2014-04-24 HISTORY — DX: Major depressive disorder, single episode, unspecified: F32.9

## 2014-04-24 HISTORY — DX: Other chronic pain: G89.29

## 2014-04-24 HISTORY — DX: Gastro-esophageal reflux disease without esophagitis: K21.9

## 2014-04-24 HISTORY — DX: Dependence on other enabling machines and devices: Z99.89

## 2014-04-24 HISTORY — DX: Migraine, unspecified, not intractable, without status migrainosus: G43.909

## 2014-04-24 LAB — BASIC METABOLIC PANEL
Anion gap: 12 (ref 5–15)
BUN: 12 mg/dL (ref 6–23)
CHLORIDE: 104 meq/L (ref 96–112)
CO2: 25 mEq/L (ref 19–32)
Calcium: 9.8 mg/dL (ref 8.4–10.5)
Creatinine, Ser: 0.69 mg/dL (ref 0.50–1.10)
GFR calc Af Amer: >90 mL/min (ref >90)
GFR calc non Af Amer: >90 mL/min (ref >90)
Glucose, Bld: 135 mg/dL — ABNORMAL HIGH (ref 70–99)
POTASSIUM: 4.2 meq/L (ref 3.7–5.3)
SODIUM: 141 meq/L (ref 137–147)

## 2014-04-24 LAB — CBC
HCT: 37.9 % (ref 36.0–46.0)
Hemoglobin: 11.9 g/dL — ABNORMAL LOW (ref 12.0–15.0)
MCH: 26.9 pg (ref 26.0–34.0)
MCHC: 31.4 g/dL (ref 30.0–36.0)
MCV: 85.6 fL (ref 78.0–100.0)
Platelets: 249 10*3/uL (ref 150–400)
RBC: 4.43 MIL/uL (ref 3.87–5.11)
RDW: 15.7 % — AB (ref 11.5–15.5)
WBC: 10.2 10*3/uL (ref 4.0–10.5)

## 2014-04-24 LAB — PRO B NATRIURETIC PEPTIDE: Pro B Natriuretic peptide (BNP): 16.1 pg/mL (ref 0–125)

## 2014-04-24 LAB — I-STAT TROPONIN, ED: Troponin i, poc: 0 ng/mL (ref 0.00–0.08)

## 2014-04-24 MED ORDER — IPRATROPIUM-ALBUTEROL 0.5-2.5 (3) MG/3ML IN SOLN
3.0000 mL | Freq: Once | RESPIRATORY_TRACT | Status: AC
  Administered 2014-04-24: 3 mL via RESPIRATORY_TRACT
  Filled 2014-04-24: qty 3

## 2014-04-24 MED ORDER — IOHEXOL 350 MG/ML SOLN
100.0000 mL | Freq: Once | INTRAVENOUS | Status: AC | PRN
  Administered 2014-04-24: 100 mL via INTRAVENOUS

## 2014-04-24 MED ORDER — FENTANYL CITRATE 0.05 MG/ML IJ SOLN
50.0000 ug | Freq: Once | INTRAMUSCULAR | Status: AC
  Administered 2014-04-24: 50 ug via INTRAVENOUS
  Filled 2014-04-24: qty 2

## 2014-04-24 MED ORDER — ALBUTEROL SULFATE (2.5 MG/3ML) 0.083% IN NEBU
5.0000 mg | INHALATION_SOLUTION | Freq: Once | RESPIRATORY_TRACT | Status: AC
  Administered 2014-04-24: 5 mg via RESPIRATORY_TRACT
  Filled 2014-04-24: qty 6

## 2014-04-24 NOTE — ED Notes (Signed)
Patient transported to CT 

## 2014-04-24 NOTE — ED Notes (Signed)
Lt upper ant. Cp for  Almost an hour. Sob. Hx. Of asthma. "arm feels funny."  Non productive cough.

## 2014-04-24 NOTE — H&P (Signed)
Date: 04/24/2014               Patient Name:  Kirsten Gonzalez MRN: [id number]  DOB: 1961-08-17 Age / Sex: 53 y.o., female   PCP: Charlette Caffey, MD         Medical Service: Internal Medicine Teaching Service         Attending Physician: Dr. Kloee Ballew Spain, MD    First Contact: Dr. Leatha Gilding Pager: [phone]  Second Contact: Dr. Virgina Organ Pager: [phone]       After Hours (After 5p/  First Contact Pager: [phone]  weekends / holidays): Second Contact Pager: [phone]   Chief Complaint: Chest pain  History of Present Illness: Kirsten Gonzalez is a 53 y.o. female w/ PMHx of HTN, DM type II, hx of PE, COPD, and morbid obesity, presents to the ED w/ complaints of chest pain. Pt states she felt a tight, pressure-like sensation on the left side of her sternum with radiation of pain into her left arm while she was laying down this evening. She states the chest pain felt like someone was standing on her chest. The left arm sensation was a numb and tingling sensation. Pt stood up and sat in a chair, but the pain did not dissipate and she began to have shortness of breath, nausea and dizziness. Pain was a 9/10 and has come down to a 5/10. The pain does not increase with deep inspiration, movement or palpation of chest wall. She has felt chest pain like this before, 2 months ago, but did not go to a hospital. She has had an echocardiogram in the past which showed enlargement of her heart, but she has never had a stress test or catheterization. She is not aware of any heart disease. Her father has a long history of heart disease and has experienced 6 heart attacks. Patient denies tobacco use. She has a history of chest pain and shortness of breath on exertion, but not at rest. She has a hx of PE in the past, but the pain does not feel the same. She also has a history of COPD and GERD, but reports that the pain does not feel the same as an exacerbation or reflux. Pain has improved some after  albuterol breathing treatments and fentanyl administration.    Meds: No current facility-administered medications for this encounter.   Current Outpatient Prescriptions  Medication Sig Dispense Refill  . ergocalciferol (VITAMIN D2) 50000 UNITS capsule Take 50,000 Units by mouth every Sunday.      . fluticasone (FLONASE) 50 MCG/ACT nasal spray Place 1 spray into both nostrils daily as needed (congestion).      . gabapentin (NEURONTIN) 600 MG tablet Take 300-600 mg by mouth 3 (three) times daily. Take 1/2 tablet (300 mg) every morning (8am)and afternoon (2pm),  take1 tablet (600 mg) at bedtime      . insulin aspart (NOVOLOG FLEXPEN) 100 UNIT/ML SOPN FlexPen Inject 20-30 Units into the skin 3 (three) times daily with meals. Per sliding scale      . Insulin Glargine (LANTUS SOLOSTAR) 100 UNIT/ML SOPN Inject 100 Units into the skin at bedtime.       . Ipratropium-Albuterol (COMBIVENT RESPIMAT) 20-100 MCG/ACT AERS respimat Inhale 2 puffs into the lungs 2 (two) times daily as needed (asthma symptoms).      Marland Kitchen lisinopril-hydrochlorothiazide (PRINZIDE,ZESTORETIC) 20-25 MG per tablet Take 1 tablet by mouth daily.      . naproxen (NAPROSYN) 500 MG tablet Take 500 mg by  mouth 2 (two) times daily with a meal.      . oxybutynin (DITROPAN-XL) 10 MG 24 hr tablet Take 10 mg by mouth daily.      . Oxycodone HCl 10 MG TABS Take 10 mg by mouth 3 (three) times daily as needed (pain).      Bertram Gala. Polyethyl Glycol-Propyl Glycol (SYSTANE OP) Place 2 drops into both eyes as needed (dry eyes).      . rivaroxaban (XARELTO) 20 MG TABS tablet Take 20 mg by mouth daily with breakfast.      . valACYclovir (VALTREX) 500 MG tablet Take 500 mg by mouth 2 (two) times daily.        Allergies: Allergies as of 04/24/2014 - Review Complete 04/24/2014  Allergen Reaction Noted  . Ceftin [cefuroxime axetil] Shortness Of Breath, Nausea And Vomiting, and Rash 08/22/2013  . Celebrex [celecoxib] Other (See Comments) 08/22/2013  . Coconut  oil Nausea Only 01/14/2013  . Hydrocodone Nausea And Vomiting 08/22/2013  . Mushroom extract complex Nausea And Vomiting 01/14/2013   Past Medical History  Diagnosis Date  . Obesity   . Diabetes mellitus without complication   . Hypertension   . Asthma   . COPD (chronic obstructive pulmonary disease)   . Back pain, chronic   . Hip pain, chronic    Past Surgical History  Procedure Laterality Date  . Breast reduction surgery    . Hernia repair    . Tubal ligation    . Ankle arthroscopy     History reviewed. No pertinent family history. History   Social History  . Marital Status: Single    Spouse Name: N/A    Number of Children: N/A  . Years of Education: N/A   Occupational History  . Not on file.   Social History Main Topics  . Smoking status: Former Smoker    Types: Cigarettes, Cigars    Quit date: 02/28/2000  . Smokeless tobacco: Never Used  . Alcohol Use: No  . Drug Use: No  . Sexual Activity: Not on file   Other Topics Concern  . Not on file   Social History Narrative  . No narrative on file    Review of Systems: General: Denies fever, chills, diaphoresis, appetite change and fatigue.  Respiratory: Admits to SOB, DOE, cough, chest tightness. Denies wheezing.   Cardiovascular: Admits to chest pain, chest pressure. Denies palpitations.  Gastrointestinal: Admits to nausea. Denies vomiting, abdominal pain, diarrhea, constipation, blood in stool and abdominal distention.  Genitourinary: Denies dysuria, urgency, frequency, hematuria, and flank pain. Endocrine: Denies hot or cold intolerance, polyuria, and polydipsia. Musculoskeletal: Denies myalgias, back pain, joint swelling, arthralgias and gait problem.  Skin: Denies pallor, rash and wounds.  Neurological: Admits to dizziness. Denies syncope, weakness, numbness and headaches.  Psychiatric/Behavioral: Denies mood changes, confusion, nervousness, sleep disturbance and agitation.  Physical Exam: Filed Vitals:    04/24/14 2100 04/24/14 2115 04/24/14 2130 04/24/14 2230  BP: 132/65 137/74 121/64 137/73  Pulse: 91 109 112 100  Temp:      Resp: 12 16 16 15   Height:      Weight:      SpO2: 100% 94% 91% 90%   General: Vital signs reviewed.  Patient is a well-developed and well-nourished, in no acute distress and cooperative with exam.  Head: Normocephalic and atraumatic. Eyes: EOMI, conjunctivae normal, No scleral icterus.  Neck: Supple, trachea midline, normal ROM, No JVD, masses, thyromegaly present.  Cardiovascular: RRR, S1 normal, S2 normal, no murmurs, gallops, or rubs.  Pulmonary/Chest: Air entry equal bilaterally, no wheezes, rales, or rhonchi. Abdominal: Soft, non-tender, non-distended, BS +, no masses, organomegaly, or guarding present.  Musculoskeletal: No joint deformities, erythema, or stiffness, ROM full and nontender. Extremities: 1+ pitting edema in lower extremities bilaterally,  pulses symmetric and intact bilaterally. No cyanosis or clubbing. Neurological: A&O x3, no focal motor deficit, sensory intact to light touch bilaterally.  Skin: Warm, dry and intact. No rashes or erythema. Psychiatric: Normal mood and affect. speech and behavior is normal. Cognition and memory are normal.     Lab results: Basic Metabolic Panel:  Recent Labs  16/10/96 1955  NA 141  K 4.2  CL 104  CO2 25  GLUCOSE 135*  BUN 12  CREATININE 0.69  CALCIUM 9.8   CBC:  Recent Labs  04/24/14 1955  WBC 10.2  HGB 11.9*  HCT 37.9  MCV 85.6  PLT 249   BNP:  Recent Labs  04/24/14 1955  PROBNP 16.1   Imaging results:  Ct Angio Chest W/cm &/or Wo Cm  04/24/2014   CLINICAL DATA:  Worsening shortness of breath.  EXAM: CT ANGIOGRAPHY CHEST WITH CONTRAST  TECHNIQUE: Multidetector CT imaging of the chest was performed using the standard protocol during bolus administration of intravenous contrast. Multiplanar CT image reconstructions and MIPs were obtained to evaluate the vascular anatomy.  CONTRAST:   OMNIPAQUE IOHEXOL 350 MG/ML SOLN  COMPARISON:  02/27/2014  FINDINGS: No filling defects in the central or segmental pulmonary arteries to suggest pulmonary emboli. Opacification of the peripheral arteries is suboptimal related to body habitus and breathing motion. Stable peripheral interstitial opacities in the lungs compatible with scarring. No confluent opacities or acute opacities. No effusions.  Heart is mildly enlarged. Aorta is normal caliber. No mediastinal, hilar, or axillary adenopathy. Chest wall soft tissues are unremarkable. Imaging into the upper abdomen shows no acute findings.  No acute bony abnormality or focal bone lesion.  Review of the MIP images confirms the above findings.  IMPRESSION: Suboptimal visualization of the peripheral pulmonary arteries, similar to prior study. No central or segmental pulmonary emboli noted.  Stable areas of peripheral scarring in the lungs.  Mild cardiomegaly.   Electronically Signed   By: Charlett Nose M.D.   On: 04/24/2014 22:19    Other results: EKG: NSR @ 84 bpm. No dynamic ST/T wave changes.   Assessment & Plan by Problem: Ms. CHARLINA DWIGHT is a 53 y.o. female w/ PMHx of HTN, DM type II, COPD, and morbid obesity, admitted for evaluation of chest pain.  Chest Pain- Pt presented with chest pain, chest pressure with radiation of pain into left arm. Pain and SOB at rest. Pain not reproducible with deep inspiration, palpation of chest wall or with cough. EKG shows NSR @ 84 bpm. No ST/T wave changes. CT angiogram chest with contrast showed no confluent opacities or acute opacities. No effusions. Heart is mildly enlarged. No central or segmental pulmonary emboli  noted. POC troponin negative in the ED. TIMI risk score of 2 making her low risk for cardiac event in the next 14 days. Vital signs stable- normotensive 120/63, HR 91, RR 15, pulse ox 97% on room air. Chest pain is typical, likely cardiac in nature. PE r/o with CTA. Consider COPD  exacerbation given history of COPD and that pain improved after nebulizer treatments. Costocondritis r/o since pain was not reproducible with palpation. Consider GERD, although patient states this pain is not like her usual reflux.  -Admit to telemetry -Trend troponins -Order  2D echocardiogram -Zofran 4 mg prn nausea -Carb modified diet -Bed rest -Morphine 2 mg Q4H prn severe pain  H/o PE- Recent admission on 02/27/14; patient found to have distal peripheral left lung pulmonary embolism on CTA, started on Xarelto. Patient had GYN procedure 1 week previous, therefore event considered to be provoked. Will need 6 months total of Xarelto therapy. CTA also performed on this admission (results shown above), not suggestive of new PE. -Continue Xarelto  COPD- Pt has known hx of COPD. On ipratropium-albuterol at home. Pulse ox 97% on RA. Denies current shortness of breath. -Duoneb treatments prn SOB  HTN- Normotensive on admission. On Prinzide 20-25 at home.  -Continue Prinzide  DM type II w/ neuropathy- Most recent HbA1c 7.8 as of 02/28/14. Uses Lantus 100 units qhs at home + Novolog 20-30 unit SS w/ meals. -75 Units Lantus with Sliding Scale Insulin Resistant -Continue gabapentin   OSA- Hx of OSA. Uses CPAP at night. -Continue CPAP at night  Genital Herpes- Stable. On Valtrex at home. -Continue valacyclovir 500 mg BID  DVT/PE PPx- Xarelto  Dispo: Disposition is deferred at this time, awaiting improvement of current medical problems. Anticipated discharge in approximately 1-2 day(s).   The patient does have a current PCP (Lakshmi Ricka BurdockP Paruchuri, MD) and [facility] not [facility] after discharge.  The patient does not have transportation limitations that hinder transportation to clinic appointments.  Signed: Jill AlexandersAlexa Richardson, DO PGY-1 Internal Medicine Resident Pager # [phone] 04/25/2014 1:04 AM

## 2014-04-24 NOTE — ED Provider Notes (Signed)
CSN: [id number]     Arrival date & time 04/24/14  1926 History   First MD Initiated Contact with Patient 04/24/14 1950     Chief Complaint  Patient presents with  . Chest Pain     (Consider location/radiation/quality/duration/timing/severity/associated sxs/prior Treatment) HPI 53 year old female presents with chest pressure, left arm pain, shortness of breath, and dizziness that started this morning. She states it has been worsening throughout the day. She feels like similar to what she's had COPD issues. She also notes that a couple months ago she was diagnosed with pulmonary embolism and started on Xarelto. It is similar to that as well. She developed a cough just prior to arrival. No fevers. Her arm feels almost numb. She's never had any history of heart problems that she knows of. She was given an albuterol prior to my arrival and states she feels somewhat improved.  Past Medical History  Diagnosis Date  . Obesity   . Diabetes mellitus without complication   . Hypertension   . Asthma   . COPD (chronic obstructive pulmonary disease)   . Back pain, chronic   . Hip pain, chronic    Past Surgical History  Procedure Laterality Date  . Breast reduction surgery    . Hernia repair    . Tubal ligation    . Ankle arthroscopy     History reviewed. No pertinent family history. History  Substance Use Topics  . Smoking status: Former Smoker    Types: Cigarettes, Cigars    Quit date: 02/28/2000  . Smokeless tobacco: Never Used  . Alcohol Use: No   OB History   Grav Para Term Preterm Abortions TAB SAB Ect Mult Living                 Review of Systems  Constitutional: Negative for fever and diaphoresis.  Respiratory: Positive for cough and shortness of breath.   Cardiovascular: Positive for chest pain. Negative for leg swelling.  Neurological: Positive for dizziness and numbness (left arm).  All other systems reviewed and are negative.     Allergies  Ceftin; Celebrex; Coconut  oil; Hydrocodone; and Mushroom extract complex  Home Medications   Prior to Admission medications   Medication Sig Start Date End Date Taking? Authorizing Provider  ergocalciferol (VITAMIN D2) 50000 UNITS capsule Take 50,000 Units by mouth every Sunday.   Yes Historical Provider, MD  fluticasone (FLONASE) 50 MCG/ACT nasal spray Place 1 spray into both nostrils daily as needed (congestion).   Yes Historical Provider, MD  gabapentin (NEURONTIN) 600 MG tablet Take 300-600 mg by mouth 3 (three) times daily. Take 1/2 tablet (300 mg) every morning (8am)and afternoon (2pm),  take1 tablet (600 mg) at bedtime   Yes Historical Provider, MD  insulin aspart (NOVOLOG FLEXPEN) 100 UNIT/ML SOPN FlexPen Inject 20-30 Units into the skin 3 (three) times daily with meals. Per sliding scale   Yes Historical Provider, MD  Insulin Glargine (LANTUS SOLOSTAR) 100 UNIT/ML SOPN Inject 100 Units into the skin at bedtime.    Yes Historical Provider, MD  Ipratropium-Albuterol (COMBIVENT RESPIMAT) 20-100 MCG/ACT AERS respimat Inhale 2 puffs into the lungs 2 (two) times daily as needed (asthma symptoms).   Yes Historical Provider, MD  lisinopril-hydrochlorothiazide (PRINZIDE,ZESTORETIC) 20-25 MG per tablet Take 1 tablet by mouth daily.   Yes Historical Provider, MD  naproxen (NAPROSYN) 500 MG tablet Take 500 mg by mouth 2 (two) times daily with a meal.   Yes Historical Provider, MD  oxybutynin (DITROPAN-XL) 10 MG 24 hr  tablet Take 10 mg by mouth daily.   Yes Historical Provider, MD  Oxycodone HCl 10 MG TABS Take 10 mg by mouth 3 (three) times daily as needed (pain).   Yes Historical Provider, MD  Polyethyl Glycol-Propyl Glycol (SYSTANE OP) Place 2 drops into both eyes as needed (dry eyes).   Yes Historical Provider, MD  rivaroxaban (XARELTO) 20 MG TABS tablet Take 20 mg by mouth daily with breakfast.   Yes Historical Provider, MD  valACYclovir (VALTREX) 500 MG tablet Take 500 mg by mouth 2 (two) times daily.   Yes Historical  Provider, MD   BP 149/86  Pulse 85  Temp(Src) 98.1 F (36.7 C)  Resp 20  Ht 5\' 8"  (1.727 m)  Wt 374 lb (169.645 kg)  BMI 56.88 kg/m2  SpO2 98% Physical Exam  Nursing note and vitals reviewed. Constitutional: She is oriented to person, place, and time. She appears well-developed and well-nourished.  obese  HENT:  Head: Normocephalic and atraumatic.  Right Ear: External ear normal.  Left Ear: External ear normal.  Nose: Nose normal.  Eyes: Right eye exhibits no discharge. Left eye exhibits no discharge.  Cardiovascular: Normal rate, regular rhythm and normal heart sounds.   Pulmonary/Chest: Effort normal. She has wheezes (mild expiratory wheezes).  Abdominal: Soft. She exhibits no distension. There is no tenderness.  Neurological: She is alert and oriented to person, place, and time.  Skin: Skin is warm and dry.    ED Course  Procedures (including critical care time) Labs Review Labs Reviewed  CBC - Abnormal; Notable for the following:    Hemoglobin 11.9 (*)    RDW 15.7 (*)    All other components within normal limits  BASIC METABOLIC PANEL - Abnormal; Notable for the following:    Glucose, Bld 135 (*)    All other components within normal limits  PRO B NATRIURETIC PEPTIDE  I-STAT TROPOININ, ED    Imaging Review Ct Angio Chest W/cm &/or Wo Cm  04/24/2014   CLINICAL DATA:  Worsening shortness of breath.  EXAM: CT ANGIOGRAPHY CHEST WITH CONTRAST  TECHNIQUE: Multidetector CT imaging of the chest was performed using the standard protocol during bolus administration of intravenous contrast. Multiplanar CT image reconstructions and MIPs were obtained to evaluate the vascular anatomy.  CONTRAST:  100mL OMNIPAQUE IOHEXOL 350 MG/ML SOLN  COMPARISON:  02/27/2014  FINDINGS: No filling defects in the central or segmental pulmonary arteries to suggest pulmonary emboli. Opacification of the peripheral arteries is suboptimal related to body habitus and breathing motion. Stable peripheral  interstitial opacities in the lungs compatible with scarring. No confluent opacities or acute opacities. No effusions.  Heart is mildly enlarged. Aorta is normal caliber. No mediastinal, hilar, or axillary adenopathy. Chest wall soft tissues are unremarkable. Imaging into the upper abdomen shows no acute findings.  No acute bony abnormality or focal bone lesion.  Review of the MIP images confirms the above findings.  IMPRESSION: Suboptimal visualization of the peripheral pulmonary arteries, similar to prior study. No central or segmental pulmonary emboli noted.  Stable areas of peripheral scarring in the lungs.  Mild cardiomegaly.   Electronically Signed   By: Charlett NoseKevin  Dover M.D.   On: 04/24/2014 22:19     EKG Interpretation   Date/Time:  Friday April 24 2014 19:29:16 EDT Ventricular Rate:  84 PR Interval:  148 QRS Duration: 92 QT Interval:  382 QTC Calculation: 451 R Axis:   16 Text Interpretation:  Normal sinus rhythm Normal ECG No significant change  since  last tracing Confirmed by Kara Mierzejewski  MD, Mikey Maffett ([id number]) on 04/24/2014  8:25:44 PM      MDM   Final diagnoses:  Chest pressure    Patient's symptoms improved with DuoNeb and fentanyl. Her CT angiogram negative for worsening PEs. She feels like it's possibly due to her COPD, but her symptoms are concerning for coronary disease given all of her symptoms combined. I do not hear significant wheezing that would suggest a severe COPD exacerbation causing this. Given this, will admit to internal medicine for ACS rule out on overnight observation.    Audree CamelScott T Zaleigh Bermingham, MD 04/24/14 [phone]

## 2014-04-24 NOTE — ED Notes (Signed)
Patient in CT

## 2014-04-24 NOTE — ED Notes (Signed)
Breathing treatment complete 

## 2014-04-25 ENCOUNTER — Encounter (HOSPITAL_COMMUNITY): Payer: Self-pay | Admitting: General Practice

## 2014-04-25 DIAGNOSIS — J441 Chronic obstructive pulmonary disease with (acute) exacerbation: Secondary | ICD-10-CM | POA: Diagnosis not present

## 2014-04-25 DIAGNOSIS — R0789 Other chest pain: Secondary | ICD-10-CM | POA: Diagnosis not present

## 2014-04-25 DIAGNOSIS — I200 Unstable angina: Secondary | ICD-10-CM | POA: Diagnosis present

## 2014-04-25 DIAGNOSIS — J4489 Other specified chronic obstructive pulmonary disease: Secondary | ICD-10-CM | POA: Diagnosis not present

## 2014-04-25 DIAGNOSIS — I369 Nonrheumatic tricuspid valve disorder, unspecified: Secondary | ICD-10-CM

## 2014-04-25 DIAGNOSIS — J449 Chronic obstructive pulmonary disease, unspecified: Secondary | ICD-10-CM | POA: Diagnosis not present

## 2014-04-25 DIAGNOSIS — A6000 Herpesviral infection of urogenital system, unspecified: Secondary | ICD-10-CM

## 2014-04-25 DIAGNOSIS — G4733 Obstructive sleep apnea (adult) (pediatric): Secondary | ICD-10-CM

## 2014-04-25 DIAGNOSIS — R42 Dizziness and giddiness: Secondary | ICD-10-CM | POA: Diagnosis not present

## 2014-04-25 DIAGNOSIS — E1142 Type 2 diabetes mellitus with diabetic polyneuropathy: Secondary | ICD-10-CM

## 2014-04-25 DIAGNOSIS — I10 Essential (primary) hypertension: Secondary | ICD-10-CM | POA: Diagnosis not present

## 2014-04-25 DIAGNOSIS — M79609 Pain in unspecified limb: Secondary | ICD-10-CM | POA: Diagnosis not present

## 2014-04-25 DIAGNOSIS — E1149 Type 2 diabetes mellitus with other diabetic neurological complication: Secondary | ICD-10-CM

## 2014-04-25 LAB — GLUCOSE, CAPILLARY
GLUCOSE-CAPILLARY: 115 mg/dL — AB (ref 70–99)
Glucose-Capillary: 146 mg/dL — ABNORMAL HIGH (ref 70–99)
Glucose-Capillary: 133 mg/dL — ABNORMAL HIGH (ref 70–99)
Glucose-Capillary: 157 mg/dL — ABNORMAL HIGH (ref 70–99)

## 2014-04-25 LAB — TROPONIN I: Troponin I: <0.3 ng/mL (ref <0.30)

## 2014-04-25 MED ORDER — ASPIRIN 81 MG PO CHEW
81.0000 mg | CHEWABLE_TABLET | Freq: Every day | ORAL | Status: DC
  Administered 2014-04-25: 81 mg via ORAL
  Filled 2014-04-25: qty 1

## 2014-04-25 MED ORDER — GABAPENTIN 600 MG PO TABS
600.0000 mg | ORAL_TABLET | Freq: Every day | ORAL | Status: DC
  Administered 2014-04-25: 600 mg via ORAL
  Filled 2014-04-25 (×2): qty 1

## 2014-04-25 MED ORDER — RIVAROXABAN 20 MG PO TABS
20.0000 mg | ORAL_TABLET | Freq: Every day | ORAL | Status: DC
  Administered 2014-04-25: 20 mg via ORAL
  Filled 2014-04-25 (×2): qty 1

## 2014-04-25 MED ORDER — VITAMIN D (ERGOCALCIFEROL) 1.25 MG (50000 UNIT) PO CAPS: 50000.0000 [IU] | ORAL_CAPSULE | ORAL | Status: DC

## 2014-04-25 MED ORDER — IPRATROPIUM-ALBUTEROL 0.5-2.5 (3) MG/3ML IN SOLN: 3.0000 mL | RESPIRATORY_TRACT | Status: DC | PRN

## 2014-04-25 MED ORDER — ASPIRIN EC 81 MG PO TBEC: 81.0000 mg | DELAYED_RELEASE_TABLET | Freq: Every day | ORAL | Status: AC

## 2014-04-25 MED ORDER — ONDANSETRON HCL 4 MG PO TABS: 4.0000 mg | ORAL_TABLET | Freq: Four times a day (QID) | ORAL | Status: DC | PRN

## 2014-04-25 MED ORDER — PANTOPRAZOLE SODIUM 40 MG PO TBEC
40.0000 mg | DELAYED_RELEASE_TABLET | Freq: Every day | ORAL | Status: DC
  Administered 2014-04-25: 40 mg via ORAL
  Filled 2014-04-25: qty 1

## 2014-04-25 MED ORDER — OXYCODONE HCL 5 MG PO TABS
10.0000 mg | ORAL_TABLET | Freq: Three times a day (TID) | ORAL | Status: DC | PRN
  Administered 2014-04-25 (×2): 10 mg via ORAL
  Filled 2014-04-25 (×2): qty 2

## 2014-04-25 MED ORDER — GABAPENTIN 300 MG PO CAPS
300.0000 mg | ORAL_CAPSULE | ORAL | Status: DC
  Administered 2014-04-25 (×2): 300 mg via ORAL
  Filled 2014-04-25 (×3): qty 1

## 2014-04-25 MED ORDER — LISINOPRIL 20 MG PO TABS
20.0000 mg | ORAL_TABLET | Freq: Every day | ORAL | Status: DC
  Administered 2014-04-25: 20 mg via ORAL
  Filled 2014-04-25: qty 1

## 2014-04-25 MED ORDER — ACETAMINOPHEN 325 MG PO TABS
325.0000 mg | ORAL_TABLET | Freq: Four times a day (QID) | ORAL | Status: DC | PRN
  Filled 2014-04-25: qty 1

## 2014-04-25 MED ORDER — CARVEDILOL 3.125 MG PO TABS
3.1250 mg | ORAL_TABLET | Freq: Two times a day (BID) | ORAL | Status: DC
  Administered 2014-04-25 (×2): 3.125 mg via ORAL
  Filled 2014-04-25 (×3): qty 1

## 2014-04-25 MED ORDER — INSULIN ASPART 100 UNIT/ML ~~LOC~~ SOLN
0.0000 [IU] | Freq: Three times a day (TID) | SUBCUTANEOUS | Status: DC
  Administered 2014-04-25: 4 [IU] via SUBCUTANEOUS

## 2014-04-25 MED ORDER — OXYBUTYNIN CHLORIDE ER 10 MG PO TB24
10.0000 mg | ORAL_TABLET | Freq: Every day | ORAL | Status: DC
  Administered 2014-04-25: 10 mg via ORAL
  Filled 2014-04-25: qty 1

## 2014-04-25 MED ORDER — ATORVASTATIN CALCIUM 80 MG PO TABS
80.0000 mg | ORAL_TABLET | Freq: Every day | ORAL | Status: DC
  Administered 2014-04-25: 80 mg via ORAL
  Filled 2014-04-25: qty 1

## 2014-04-25 MED ORDER — RANITIDINE HCL 75 MG PO TABS: 75.0000 mg | ORAL_TABLET | Freq: Two times a day (BID) | ORAL | Status: AC

## 2014-04-25 MED ORDER — VALACYCLOVIR HCL 500 MG PO TABS
500.0000 mg | ORAL_TABLET | Freq: Two times a day (BID) | ORAL | Status: DC
  Administered 2014-04-25 (×2): 500 mg via ORAL
  Filled 2014-04-25 (×3): qty 1

## 2014-04-25 MED ORDER — INSULIN GLARGINE 100 UNIT/ML ~~LOC~~ SOLN
75.0000 [IU] | Freq: Every day | SUBCUTANEOUS | Status: DC
  Administered 2014-04-25: 75 [IU] via SUBCUTANEOUS
  Filled 2014-04-25 (×2): qty 0.75

## 2014-04-25 MED ORDER — ASPIRIN EC 81 MG PO TBEC
81.0000 mg | DELAYED_RELEASE_TABLET | Freq: Every day | ORAL | Status: DC
  Filled 2014-04-25: qty 1

## 2014-04-25 MED ORDER — MORPHINE SULFATE 2 MG/ML IJ SOLN: 2.0000 mg | INTRAMUSCULAR | Status: DC | PRN

## 2014-04-25 MED ORDER — HYDROCHLOROTHIAZIDE 25 MG PO TABS
25.0000 mg | ORAL_TABLET | Freq: Every day | ORAL | Status: DC
  Administered 2014-04-25: 25 mg via ORAL
  Filled 2014-04-25: qty 1

## 2014-04-25 MED ORDER — LISINOPRIL-HYDROCHLOROTHIAZIDE 20-25 MG PO TABS: 1.0000 | ORAL_TABLET | Freq: Every day | ORAL | Status: DC

## 2014-04-25 MED ORDER — GABAPENTIN 600 MG PO TABS
300.0000 mg | ORAL_TABLET | Freq: Every day | ORAL | Status: DC
  Filled 2014-04-25: qty 1

## 2014-04-25 MED ORDER — ONDANSETRON HCL 4 MG/2ML IJ SOLN
4.0000 mg | Freq: Four times a day (QID) | INTRAMUSCULAR | Status: DC | PRN
  Administered 2014-04-25: 4 mg via INTRAVENOUS
  Filled 2014-04-25: qty 2

## 2014-04-25 NOTE — Progress Notes (Signed)
Subjective: [name] is feeling better this morning. While the pain she felt last night was 10/10 in intensity and radiated to her left arm, accompanied by numbness/tingling, today she simply feels soreness, which she rates at a 6/10. It is in the same area. While yesterday she felt short of breath walking to the bathroom, today she is "less winded" on walking a short distance. She felt pressure on her chest yesterday, but this has subsided today.  Objective: Vital signs in last 24 hours: Filed Vitals:   04/24/14 2330 04/25/14 0000 04/25/14 0023 04/25/14 0514  BP: 118/65 133/73 143/64 133/86  Pulse: 95 93 91 89  Temp:   97.7 F (36.5 C) 97.8 F (36.6 C)  TempSrc:   Oral Oral  Resp: 15 21 18 18   Height:   5\' 8"  (1.727 m)   Weight:   374 lb 12.5 oz (170 kg)   SpO2: 96% 93% 94% 93%    Intake/Output Summary (Last 24 hours) at 04/25/14 1000 Last data filed at 04/25/14 0025  Gross per 24 hour  Intake      0 ml  Output    400 ml  Net   -400 ml   Physical Exam General: sitting up comfortably in bed eating breakfast. Eyes: PERRL Neck: supple, no JVD Cardio: RRR, normal S1/S2, no M/R/G Pulmonary: CTAB, no M/R/G Abdominal: obese, +BS, soft, non-tender, non-distended Extremities: 1+ non-pitting edema in lower extremities b/l, pulses symmetric and strong Skin: warm, dry  Pertinent Lab Results: troponins negative x2  CBC    Component Value Date/Time   WBC 10.2 04/24/2014 1955   RBC 4.43 04/24/2014 1955   HGB 11.9* 04/24/2014 1955   HCT 37.9 04/24/2014 1955   PLT 249 04/24/2014 1955   MCV 85.6 04/24/2014 1955   MCH 26.9 04/24/2014 1955   MCHC 31.4 04/24/2014 1955   RDW 15.7* 04/24/2014 1955   LYMPHSABS 4.9* 03/10/2014 2118   MONOABS 1.0 03/10/2014 2118   EOSABS 0.1 03/10/2014 2118   BASOSABS 0.0 03/10/2014 2118   BMP: Na 141, K 4.2, Cl 104, CO2 25, BUN 12, Cr 0.69, Glucose 135, Ca 9.8  GFR 83  Studies/Results: Ct Angio Chest W/cm &/or Wo Cm  04/24/2014   CLINICAL DATA:  Worsening  shortness of breath.  EXAM: CT ANGIOGRAPHY CHEST WITH CONTRAST  TECHNIQUE: Multidetector CT imaging of the chest was performed using the standard protocol during bolus administration of intravenous contrast. Multiplanar CT image reconstructions and MIPs were obtained to evaluate the vascular anatomy.  CONTRAST:  100mL OMNIPAQUE IOHEXOL 350 MG/ML SOLN  COMPARISON:  02/27/2014  FINDINGS: No filling defects in the central or segmental pulmonary arteries to suggest pulmonary emboli. Opacification of the peripheral arteries is suboptimal related to body habitus and breathing motion. Stable peripheral interstitial opacities in the lungs compatible with scarring. No confluent opacities or acute opacities. No effusions.  Heart is mildly enlarged. Aorta is normal caliber. No mediastinal, hilar, or axillary adenopathy. Chest wall soft tissues are unremarkable. Imaging into the upper abdomen shows no acute findings.  No acute bony abnormality or focal bone lesion.  Review of the MIP images confirms the above findings.  IMPRESSION: Suboptimal visualization of the peripheral pulmonary arteries, similar to prior study. No central or segmental pulmonary emboli noted.  Stable areas of peripheral scarring in the lungs.  Mild cardiomegaly.   Electronically Signed   By: Charlett NoseKevin  Dover M.D.   On: 04/24/2014 22:19   EKGs: rate 91, NSR; rate 84, NSR  Echocardiogram to  be completed today  Medications: I have reviewed the patient's current medications. Scheduled Meds: . aspirin  81 mg Oral Daily  . atorvastatin  80 mg Oral q1800  . carvedilol  3.125 mg Oral BID WC  . gabapentin  300 mg Oral 2 times per day  . gabapentin  600 mg Oral QHS  . lisinopril  20 mg Oral Daily   And  . hydrochlorothiazide  25 mg Oral Daily  . insulin aspart  0-20 Units Subcutaneous TID WC  . insulin glargine  75 Units Subcutaneous QHS  . oxybutynin  10 mg Oral Daily  . rivaroxaban  20 mg Oral Q breakfast  . valACYclovir  500 mg Oral BID  . [START  ON 04/26/2014] Vitamin D (Ergocalciferol)  50,000 Units Oral Q Sun   PRN Meds:.ipratropium-albuterol, morphine injection, ondansetron (ZOFRAN) IV, ondansetron, oxyCODONE  Assessment/Plan: Active Problems:   Chest pressure   Chest pain  This 53 yo woman HTN, DMII, COPD and morbid obesity and a history of a provoked PE is experiencing unstable angina. Her symptoms were initially very concerning for MI, considering the location, radiation and quality of her pain and numbness along with her shortness of breath on minimal exertion. Though her enzymes and EKG were normal, a cardiac evaluation is necessary.  Chest Pain: Pt presented with chest pain, chest pressure with radiation of pain into left arm while at rest. She experienced shortness of breath on minimal exertion. Though her enzymes (troponin x2) have been negative to date, her presentation is very concerning for unstable angina. TIMI risk 2. Pulmonary embolism is much lower on the differential (see below), but must remain given the patient's history.  - Admit to telemetry  - Awaiting 3rd troponin  - F/u echocardiogram  - started beta blocker, ASA, statin - Zofran 4 mg prn nausea  - Carb modified diet  - Bed rest  - Morphine 2 mg Q4H prn severe pain  - Cardiology consult History of provoked PE: Recent admission on 02/27/14; patient found to have distal peripheral left lung pulmonary embolism on CTA, started on Xarelto. Patient had GYN procedure 1 week previous. Will need 6 months total of Xarelto therapy. CTA also performed on this admission (results shown above), not suggestive of new PE.  - Continue Xarelto  COPD: Pt has known hx of COPD. On ipratropium-albuterol at home. Pulse ox 97% on RA. Denies current shortness of breath.  - Duoneb treatments prn SOB  HTN: Normotensive on admission. On Prinzide 20-25 at home.  - Continue Prinzide  DM type II w/ neuropathy: Most recent HbA1c 7.8 as of 02/28/14. Uses Lantus 100 units qhs at home + Novolog  20-30 unit SS w/ meals.  - 75 Units Lantus with Sliding Scale Insulin Resistant  - Continue gabapentin  OSA: Hx of OSA. Uses CPAP at night.  - Continue CPAP at night  Genital Herpes: Stable. On Valtrex at home.  - Continue valacyclovir 500 mg BID   Dispo: Disposition is deferred at this time, awaiting improvement of current medical problems.  Anticipated discharge in approximately 2 day(s).   Consult cardiology  The patient does have a current PCP (Lakshmi Ricka Burdock, MD) and does need an [facility] hospital follow-up appointment after discharge.  The patient does not have transportation limitations that hinder transportation to clinic appointments.  .Services Needed at time of discharge: Y = Yes, Blank = No PT:   OT:   RN:   Equipment:   Other:     LOS: 1 day  Dionne AnoJulia Pola Furno, MD 04/25/2014, 10:00 AM

## 2014-04-25 NOTE — Discharge Summary (Signed)
Name: Kirsten Gonzalez MRN: [id number] DOB: 07/07/1961 53 y.o. PCP: Charlette Caffey, MD  Date of Admission: 04/24/2014  7:41 PM Date of Discharge: 04/25/2014 Attending Physician: Burns Spain, MD  Discharge Diagnosis: Chest Pain / AMI rule-out, possible GERD, cardiac disease unable to be ruled-out Principal Problem:   Unstable angina Active Problems:   Hx of pulmonary embolus on anticoagulation   Type II or unspecified type diabetes mellitus without mention of complication, not stated as uncontrolled   Essential hypertension, benign   Morbid obesity with BMI of 50.0-59.9, adult  Discharge Medications:   Medication List    ASK your doctor about these medications       COMBIVENT RESPIMAT 20-100 MCG/ACT Aers respimat  Generic drug:  Ipratropium-Albuterol  Inhale 2 puffs into the lungs 2 (two) times daily as needed (asthma symptoms).     ergocalciferol 50000 UNITS capsule  Commonly known as:  VITAMIN D2  Take 50,000 Units by mouth every Sunday.     fluticasone 50 MCG/ACT nasal spray  Commonly known as:  FLONASE  Place 1 spray into both nostrils daily as needed (congestion).     gabapentin 600 MG tablet  Commonly known as:  NEURONTIN  Take 300-600 mg by mouth 3 (three) times daily. Take 1/2 tablet (300 mg) every morning (8am)and afternoon (2pm),  take1 tablet (600 mg) at bedtime     LANTUS SOLOSTAR 100 UNIT/ML Solostar Pen  Generic drug:  Insulin Glargine  Inject 100 Units into the skin at bedtime.     lisinopril-hydrochlorothiazide 20-25 MG per tablet  Commonly known as:  PRINZIDE,ZESTORETIC  Take 1 tablet by mouth daily.     naproxen 500 MG tablet  Commonly known as:  NAPROSYN  Take 500 mg by mouth 2 (two) times daily with a meal.     NOVOLOG FLEXPEN 100 UNIT/ML FlexPen  Generic drug:  insulin aspart  Inject 20-30 Units into the skin 3 (three) times daily with meals. Per sliding scale     oxybutynin 10 MG 24 hr tablet  Commonly known as:   DITROPAN-XL  Take 10 mg by mouth daily.     Oxycodone HCl 10 MG Tabs  Take 10 mg by mouth 3 (three) times daily as needed (pain).     rivaroxaban 20 MG Tabs tablet  Commonly known as:  XARELTO  Take 20 mg by mouth daily with breakfast.     SYSTANE OP  Place 2 drops into both eyes as needed (dry eyes).     valACYclovir 500 MG tablet  Commonly known as:  VALTREX  Take 500 mg by mouth 2 (two) times daily.        Disposition and follow-up:   KirstenYamari A Burgner was discharged from [facility] in Stable condition.  At the hospital follow up visit please address:  1. Chest pain (may be due to GERD). If pain is not responding to GERD treatment, and etiology appears to be cardiac, patient may need to go for catheterization, as we are not able to do cardiac CT or MRI due to elevated BMI.   2. Encourage CRF and hypertension control; consider increasing dose of prinzide to achieve better BP control.    Follow-up Appointments:  Patient to see her PMD, but also is interested in new PMD. Interested in [place], who will be calling her to schedule.   Discharge Instructions: Visit PMD within 1 week of discharge. Return to hospital with recurrence of chest pain / pressure.  Consultations: Treatment Team:  Rounding Lbcardiology, MD  Procedures Performed:  Ct Angio Chest W/cm &/or Wo Cm  04/24/2014   CLINICAL DATA:  Worsening shortness of breath.  EXAM: CT ANGIOGRAPHY CHEST WITH CONTRAST  TECHNIQUE: Multidetector CT imaging of the chest was performed using the standard protocol during bolus administration of intravenous contrast. Multiplanar CT image reconstructions and MIPs were obtained to evaluate the vascular anatomy.  CONTRAST:  100mL OMNIPAQUE IOHEXOL 350 MG/ML SOLN  COMPARISON:  02/27/2014  FINDINGS: No filling defects in the central or segmental pulmonary arteries to suggest pulmonary emboli. Opacification of the peripheral arteries is suboptimal related to body  habitus and breathing motion. Stable peripheral interstitial opacities in the lungs compatible with scarring. No confluent opacities or acute opacities. No effusions.  Heart is mildly enlarged. Aorta is normal caliber. No mediastinal, hilar, or axillary adenopathy. Chest wall soft tissues are unremarkable. Imaging into the upper abdomen shows no acute findings.  No acute bony abnormality or focal bone lesion.  Review of the MIP images confirms the above findings.  IMPRESSION: Suboptimal visualization of the peripheral pulmonary arteries, similar to prior study. No central or segmental pulmonary emboli noted.  Stable areas of peripheral scarring in the lungs.  Mild cardiomegaly.   Electronically Signed   By: Charlett NoseKevin  Dover M.D.   On: 04/24/2014 22:19    2D Echo: Study Conclusions  - Left ventricle: The cavity size was normal. Wall thickness was increased increased in a pattern of mild to moderate LVH. Systolic function was normal. The estimated ejection fraction was in the range of 60% to 65%. Wall motion was normal; there were no regional wall motion abnormalities. Features are consistent with a pseudonormal left ventricular filling pattern, with concomitant abnormal relaxation and increased filling pressure (grade 2 diastolic dysfunction). - Aortic valve: There was trivial regurgitation. Valve area (VTI): 2.02 cm^2. Valve area (Vmax): 2.11 cm^2. - Left atrium: The atrium was moderately dilated. - Right atrium: The atrium was mildly dilated. - Pulmonary arteries: Systolic pressure was moderately increased. PA peak pressure: 42 mm Hg (S). - Systemic veins: The IVC is dilated with normal respiratory variation. Estimated RA pressure is 8 mmHg. - Technically difficult study.  Cardiac Cath: not performed  Admission HPI: Kirsten Gonzalez is a 53 y.o. female w/ PMHx of HTN, DM type II, hx of PE, COPD, and morbid obesity, presents to the ED w/ complaints of chest pain. Pt states she felt a  tight, pressure-like sensation on the left side of her sternum with radiation of pain into her left arm while she was laying down this evening. She states the chest pain felt like someone was standing on her chest. The left arm sensation was a numb and tingling sensation. Pt stood up and sat in a chair, but the pain did not dissipate and she began to have shortness of breath, nausea and dizziness. Pain was a 9/10 and has come down to a 5/10. The pain does not increase with deep inspiration, movement or palpation of chest wall. She has felt chest pain like this before, 2 months ago, but did not go to a hospital. She has had an echocardiogram in the past which showed enlargement of her heart, but she has never had a stress test or catheterization. She is not aware of any heart disease. Her father has a long history of heart disease and has experienced 6 heart attacks. Patient denies tobacco use. She has a history of chest pain and shortness of breath on  exertion, but not at rest. She has a hx of PE in the past, but the pain does not feel the same. She also has a history of COPD and GERD, but reports that the pain does not feel the same as an exacerbation or reflux. Pain has improved some after albuterol breathing treatments and fentanyl administration.    Hospital Course by problem list: Principal Problem:   Unstable angina Active Problems:   Hx of pulmonary embolus on anticoagulation   Type II or unspecified type diabetes mellitus without mention of complication, not stated as uncontrolled   Essential hypertension, benign   Morbid obesity with BMI of 50.0-59.9, adult   Chest Pain/Unstable Angina: Patient was admitted to tele. Aspirin, betablocker and statin were started. Troponins I x2 were negative and EKG x2 showed no acute ischemic changes. Her TIMI risk was calculated at 2. She was pain free by the am of discharge. Echo showed grade II diastolic dysfunction with mild to moderate LVH and an EF of  60-65%.  The patient's symptoms were suggestive of UA, so cardiology was consulted, who recommended treatment for GERD and doing ischemic workup with cath only if pain recurs.   History of provoked PE: Recent admission on 02/27/14; patient found to have distal peripheral left lung pulmonary embolism on CTA, started on Xarelto. Patient had GYN procedure 1 week previous to the PE earlier this year. Has started on her 6 months Xarelto therapy and no doses were missed prior to or before this hospitalization. Because of her history, CTA was performed on this admission, which did not show a new PE.   COPD: Pt has known hx of COPD. On ipratropium-albuterol at home. Pulse ox 97% on RA. Denies current shortness of breath. She received Duoneb treatments prn for shortness of breath while in the hospital.  HTN: Normotensive on admission. On Prinzide 20-25 at home. Her home Prinzide was continued.  DM type II w/ neuropathy: Most recent HbA1c 7.8 as of 02/28/14. Uses Lantus 100 units qhs at home + Novolog 20-30 unit SS w/ meals. Home gabapentin was continued.  OSA: Hx of OSA. Uses CPAP at night.  Genital Herpes: Stable. On Valtrex at home and continued in the hospital.  Discharge Vitals:   BP 134/82  Pulse 94  Temp(Src) 97.7 F (36.5 C) (Oral)  Resp 18  Ht 5\' 8"  (1.727 m)  Wt 374 lb 12.5 oz (170 kg)  BMI 57.00 kg/m2  SpO2 91%  Discharge Labs:  Results for orders placed during the hospital encounter of 04/24/14 (from the past 24 hour(s))  CBC     Status: Abnormal   Collection Time    04/24/14  7:55 PM      Result Value Ref Range   WBC 10.2  4.0 - 10.5 K/uL   RBC 4.43  3.87 - 5.11 MIL/uL   Hemoglobin 11.9 (*) 12.0 - 15.0 g/dL   HCT 16.1  09.6 - 04.5 %   MCV 85.6  78.0 - 100.0 fL   MCH 26.9  26.0 - 34.0 pg   MCHC 31.4  30.0 - 36.0 g/dL   RDW 40.9 (*) 81.1 - 91.4 %   Platelets 249  150 - 400 K/uL  BASIC METABOLIC PANEL     Status: Abnormal   Collection Time    04/24/14  7:55 PM      Result Value  Ref Range   Sodium 141  137 - 147 mEq/L   Potassium 4.2  3.7 - 5.3 mEq/L   Chloride  104  96 - 112 mEq/L   CO2 25  19 - 32 mEq/L   Glucose, Bld 135 (*) 70 - 99 mg/dL   BUN 12  6 - 23 mg/dL   Creatinine, Ser 1.610.69  0.50 - 1.10 mg/dL   Calcium 9.8  8.4 - 09.610.5 mg/dL   GFR calc non Af Amer >90  >90 mL/min   GFR calc Af Amer >90  >90 mL/min   Anion gap 12  5 - 15  PRO B NATRIURETIC PEPTIDE     Status: None   Collection Time    04/24/14  7:55 PM      Result Value Ref Range   Pro B Natriuretic peptide (BNP) 16.1  0 - 125 pg/mL  I-STAT TROPOININ, ED     Status: None   Collection Time    04/24/14  7:59 PM      Result Value Ref Range   Troponin i, poc 0.00  0.00 - 0.08 ng/mL   Comment 3           GLUCOSE, CAPILLARY     Status: Abnormal   Collection Time    04/25/14 12:26 AM      Result Value Ref Range   Glucose-Capillary 146 (*) 70 - 99 mg/dL   Comment 1 Notify RN    TROPONIN I     Status: None   Collection Time    04/25/14  1:00 AM      Result Value Ref Range   Troponin I <0.30  <0.30 ng/mL  TROPONIN I     Status: None   Collection Time    04/25/14  5:55 AM      Result Value Ref Range   Troponin I <0.30  <0.30 ng/mL  GLUCOSE, CAPILLARY     Status: Abnormal   Collection Time    04/25/14  7:22 AM      Result Value Ref Range   Glucose-Capillary 133 (*) 70 - 99 mg/dL  GLUCOSE, CAPILLARY     Status: Abnormal   Collection Time    04/25/14 11:42 AM      Result Value Ref Range   Glucose-Capillary 157 (*) 70 - 99 mg/dL  TROPONIN I     Status: None   Collection Time    04/25/14  1:12 PM      Result Value Ref Range   Troponin I <0.30  <0.30 ng/mL    Signed: Dionne AnoJulia Io Dieujuste, MD 04/25/2014, 4:29 PM    Services Ordered on Discharge: none Equipment Ordered on Discharge: none

## 2014-04-25 NOTE — Progress Notes (Signed)
Echocardiogram 2D Echocardiogram has been performed.  Dorothey BasemanReel, Daleyza Gadomski M 04/25/2014, 11:10 AM

## 2014-04-25 NOTE — Progress Notes (Signed)
  Date: 04/25/2014  Patient name: Kirsten Gonzalez  Medical record number: [id number]  Date of birth: 1960-11-08   I have seen and evaluated Kirsten Gonzalez and discussed their care with the Residency Team. Ms Kirsten Gonzalez has a h/o DM II insulin dependent, HTN, COPD, and a PE in May 2015 which occurred about one week after a Gyn procedure with postprocedure mobility reduction. She is admitted with CP which is substernal tight pressure with radition to her L arm. This occurred while sitting down and was assoc with dyspnea, N, and dizziness. She did not get NTG in the ED but did get a breathing tx.  Prior to May, she was able to walk 3-4 blocks and back with her dogs. Since May, she can only walk 1.5 blocks before getting CP and dyspnea that forces her to turn back. A few days ago, she had the pain when walking to bathroom. Now, occurred at rest.   Cardiac RF - DM (she reports A1C 7), HTN, obesity, and family hx (father in 7950's.) She is a former smoker - quit 2001.   On exam, she is obese and in NAD. She can talk in full sentences. HRRR no MRG  Labs : trop I negative x 3. EKG without ischemic changes  Assessment and Plan: I have seen and evaluated the patient as outlined above. I agree with the formulated Assessment and Plan as detailed in the residents' admission note, with the following changes:   1. Unstable angina - Cont to monitor on Tele. BB, ASA, statin. Will ask cards to eval pt - does she need inpt or outpt ischemic W/U.  2. PE - Cont Xarelto. She has not missed any doses.   Burns SpainElizabeth A Butcher, MD 7/4/201512:22 PM

## 2014-04-25 NOTE — Consult Note (Signed)
CARDIOLOGY CONSULT NOTE   Patient ID: Kirsten Gonzalez MRN: [id number] DOB/AGE: [birth year] 51 y.o.  Admit date: 04/24/2014  Primary Physician   Paruchuri, Janace HoardLakshmi P, MD Primary Cardiologist   New (pt wife sees Dr. Ladona Ridgelaylor) Reason for Consultation   Chest pain   WUJ:WJXBJYNWGHPI:[place] A Lequita HaltMorgan is a 53 y.o. female with no history of CAD.  She has multiple cardiac risk factors, including DM, HTN, HLD, morbid obesity and remote hx ETOH/cocaine abuse. She was diagnosed with PE in May 2015 after a gynecologic procedure, is on Xarelto.  On 07/03, while at rest, she had sudden onset of severe chest pressure 10/10. Associated with left arm pain, SOB, nausea. No diaphoresis. Slight wheezing. Never had before. No rx tried. Came to ER, and got 2 breathing treatments with improvement in SOB, no change in chest pain. Did not get NTG. Got fentanyl 50 mcg which decreased pain from a 9/10 to a 5/10. Pt was admitted. Pain resolved and pt able to sleep about 4:00 am. Pain has not returned. Currently with left arm ache at 4/10. Yesterday, the arm pain was 9/10. Total duration of chest pain was 12 hours.   Past Medical History  Diagnosis Date  . Obesity   . Hypertension   . Asthma   . COPD (chronic obstructive pulmonary disease)   . Hip pain, chronic   . Pulmonary embolism 02/2014  . Pneumonia     "several times" (04/25/2014)  . OSA on CPAP   . Type II diabetes mellitus   . Anemia   . History of blood transfusion 2005    "related to menstrual bleeding"  . GERD (gastroesophageal reflux disease)   . History of stomach ulcers   . NFAOZHYQ(657.8Headache(784.0)     "weekly" (04/25/2014)  . Migraine     "monthly or less now" (04/25/2014)  . Arthritis     "all over"  . Chronic lower back pain   . Bipolar disorder dx'd 03/2014  . Depression      Past Surgical History  Procedure Laterality Date  . Tubal ligation  1988  . Ankle fracture surgery Right 1972  . Reduction mammaplasty Bilateral 2011  . Tonsillectomy  1993   . Hernia repair    . Umbilical hernia repair  ~ 2010    "my 2nd hernia repair that year"  . Abdominal hernia repair  ~ 2010    "my 1st hernia repair that year"  . Carpal tunnel release Left   . Dilation and curettage of uterus  1986    "miscarriage"  . Uterine fibroid embolization  2005    Allergies  Allergen Reactions  . Ceftin [Cefuroxime Axetil] Shortness Of Breath, Nausea And Vomiting and Rash  . Celebrex [Celecoxib] Other (See Comments)    Involuntary leg twitching and leg pain  . Coconut Oil Nausea Only  . Hydrocodone Nausea And Vomiting    Severe vomiting  . Mushroom Extract Complex Nausea And Vomiting   I have reviewed the patient's current medications . aspirin  81 mg Oral Daily  . atorvastatin  80 mg Oral q1800  . carvedilol  3.125 mg Oral BID WC  . gabapentin  300 mg Oral 2 times per day  . gabapentin  600 mg Oral QHS  . lisinopril  20 mg Oral Daily   And  . hydrochlorothiazide  25 mg Oral Daily  . insulin aspart  0-20 Units Subcutaneous TID WC  . insulin glargine  75 Units Subcutaneous QHS  . oxybutynin  10 mg Oral Daily  . rivaroxaban  20 mg Oral Q breakfast  . valACYclovir  500 mg Oral BID  . [START ON 04/26/2014] Vitamin D (Ergocalciferol)  50,000 Units Oral Q Sun     acetaminophen, ipratropium-albuterol, morphine injection, ondansetron (ZOFRAN) IV, ondansetron, oxyCODONE  Medication Sig  ergocalciferol (VITAMIN D2) 50000 UNITS capsule Take 50,000 Units by mouth every Sunday.  fluticasone (FLONASE) 50 MCG/ACT nasal spray Place 1 spray into both nostrils daily as needed (congestion).  gabapentin (NEURONTIN) 600 MG tablet Take 300-600 mg by mouth 3 (three) times daily. Take 1/2 tablet (300 mg) every morning (8am)and afternoon (2pm),  take1 tablet (600 mg) at bedtime  insulin aspart (NOVOLOG FLEXPEN) 100 UNIT/ML SOPN FlexPen Inject 20-30 Units into the skin 3 (three) times daily with meals. Per sliding scale  Insulin Glargine (LANTUS SOLOSTAR) 100 UNIT/ML SOPN  Inject 100 Units into the skin at bedtime.   Ipratropium-Albuterol (COMBIVENT RESPIMAT) 20-100 MCG/ACT AERS respimat Inhale 2 puffs into the lungs 2 (two) times daily as needed (asthma symptoms).  lisinopril-hydrochlorothiazide (PRINZIDE,ZESTORETIC) 20-25 MG per tablet Take 1 tablet by mouth daily.  naproxen (NAPROSYN) 500 MG tablet Take 500 mg by mouth 2 (two) times daily with a meal.  oxybutynin (DITROPAN-XL) 10 MG 24 hr tablet Take 10 mg by mouth daily.  Oxycodone HCl 10 MG TABS Take 10 mg by mouth 3 (three) times daily as needed (pain).  Polyethyl Glycol-Propyl Glycol (SYSTANE OP) Place 2 drops into both eyes as needed (dry eyes).  rivaroxaban (XARELTO) 20 MG TABS tablet Take 20 mg by mouth daily with breakfast.  valACYclovir (VALTREX) 500 MG tablet Take 500 mg by mouth 2 (two) times daily.     History   Social History  . Marital Status: Single    Spouse Name: N/A    Number of Children: N/A  . Years of Education: N/A   Occupational History  . Retired Lawyer    Social History Main Topics  . Smoking status: Former Smoker -- 1.00 packs/day for 26 years    Types: Cigarettes, Cigars    Quit date: 02/28/2000  . Smokeless tobacco: Never Used  . Alcohol Use: Yes     Comment: Hx abuse, hasn't had a drink since 2000  . Drug Use: Yes    Special: Cocaine, "Crack" cocaine     Comment: Hx cocaine use, last drug use 2000  . Sexual Activity: Not Currently   Other Topics Concern  . Not on file   Social History Narrative   Pt lives with wife.     ROS:  Full 14 point review of systems complete and found to be negative unless listed above.  Physical Exam: Blood pressure 133/86, pulse 89, temperature 97.8 F (36.6 C), temperature source Oral, resp. rate 18, height 5\' 8"  (1.727 m), weight 374 lb 12.5 oz (170 kg), SpO2 93.00%.  General: Well developed, well nourished, female in no acute distress Head: Eyes PERRLA, No xanthomas.   Normocephalic and atraumatic, oropharynx without edema or  exudate. Dentition: good Lungs: CTA bilaterally  Heart: HRRR S1 S2, no rub/gallop, soft systolic murmur at left lower sternal border. pulses are 2+ extrem.   Neck: No carotid bruits. No lymphadenopathy.  JVD not elevated. Abdomen: Bowel sounds present, abdomen soft and non-tender without masses or hernias noted. Msk:  No spine or cva tenderness. No weakness, no joint deformities or effusions. Extremities: No clubbing or cyanosis. no edema.  Neuro: Alert and oriented X 3. No focal deficits noted. Psych:  Good affect,  responds appropriately Skin: No rashes or lesions noted.  Labs:   Lab Results  Component Value Date   WBC 10.2 04/24/2014   HGB 11.9* 04/24/2014   HCT 37.9 04/24/2014   MCV 85.6 04/24/2014   PLT 249 04/24/2014     Recent Labs Lab 04/24/14 1955  NA 141  K 4.2  CL 104  CO2 25  BUN 12  CREATININE 0.69  CALCIUM 9.8  GLUCOSE 135*    Recent Labs  04/25/14 0100 04/25/14 0555  TROPONINI <0.30 <0.30    Recent Labs  04/24/14 1959  TROPIPOC 0.00   Pro B Natriuretic peptide (BNP)  Date/Time Value Ref Range Status  04/24/2014  7:55 PM 16.1  0 - 125 pg/mL Final  02/27/2014  2:52 PM 36.9  0 - 125 pg/mL Final   Lab Results  Component Value Date   DDIMER 1.30* 02/27/2014   Echo: 04/25/2014 Study Conclusions - Left ventricle: The cavity size was normal. Wall thickness was increased increased in a pattern of mild to moderate LVH. Systolic function was normal. The estimated ejection fraction was in the range of 60% to 65%. Wall motion was normal; there were no regional wall motion abnormalities. Features are consistent with a pseudonormal left ventricular filling pattern, with concomitant abnormal relaxation and increased filling pressure (grade 2 diastolic dysfunction). - Aortic valve: There was trivial regurgitation. Valve area (VTI): 2.02 cm^2. Valve area (Vmax): 2.11 cm^2. - Left atrium: The atrium was moderately dilated. - Right atrium: The atrium was mildly  dilated. - Pulmonary arteries: Systolic pressure was moderately increased. PA peak pressure: 42 mm Hg (S). - Systemic veins: The IVC is dilated with normal respiratory variation. Estimated RA pressure is 8 mmHg. - Technically difficult study.  ECG:  04/25/2014 SR, no acute ischemic changes Vent. rate 91 BPM PR interval 144 ms QRS duration 96 ms QT/QTc 394/484 ms P-R-T axes 50 34 30  Radiology:  Ct Angio Chest W/cm &/or Wo Cm 04/24/2014   CLINICAL DATA:  Worsening shortness of breath.  EXAM: CT ANGIOGRAPHY CHEST WITH CONTRAST  TECHNIQUE: Multidetector CT imaging of the chest was performed using the standard protocol during bolus administration of intravenous contrast. Multiplanar CT image reconstructions and MIPs were obtained to evaluate the vascular anatomy.  CONTRAST:  OMNIPAQUE IOHEXOL 350 MG/ML SOLN  COMPARISON:  02/27/2014  FINDINGS: No filling defects in the central or segmental pulmonary arteries to suggest pulmonary emboli. Opacification of the peripheral arteries is suboptimal related to body habitus and breathing motion. Stable peripheral interstitial opacities in the lungs compatible with scarring. No confluent opacities or acute opacities. No effusions.  Heart is mildly enlarged. Aorta is normal caliber. No mediastinal, hilar, or axillary adenopathy. Chest wall soft tissues are unremarkable. Imaging into the upper abdomen shows no acute findings.  No acute bony abnormality or focal bone lesion.  Review of the MIP images confirms the above findings.  IMPRESSION: Suboptimal visualization of the peripheral pulmonary arteries, similar to prior study. No central or segmental pulmonary emboli noted.  Stable areas of peripheral scarring in the lungs.  Mild cardiomegaly.   Electronically Signed   By: Charlett Nose M.D.   On: 04/24/2014 22:19    ASSESSMENT AND PLAN:   The patient was seen today by Dr. Ladona Ridgel, the patient evaluated and the data reviewed.  Active Problems:   Chest pressure -  pt has had 12 hours of chest pain with no enzyme elevations. ECG is not acute. Echo results above, EF normal and no WMA.  Grade 2 diast dys, raising concern for poor control of HTN.    Not able to do cardiac CT or MRI due to elevated BMI. Table in nuclear medicine goes to 500 lbs but not sure images would be adequate with morbid obesity. MD advise on further evaluation.     Encourage CRF control, will leave to primary MD.  Otherwise, per IM.   Chest pain   Signed: Leanna BattlesRhonda Barrett, PA-C 04/25/2014 12:37 PM Beeper [phone]  Cardiology Attending  Patient seen and examined. I agree with the history, physical exam, assessment and plan with modifications by me. The patient has multiple cardiac risk factors who presents with prolonged SSCP and all objective evidence for ischemia negative. Her mobid obesity make her difficult to evaluate. I would suggest emperic treatment of acid reflux. Ultimately if she continues to have chest pain she will end up with heart catheterization as her morbid obesity makes the accuracy of perfusion stress testing poor.  Leonia ReevesGregg Samiel Peel,M.D.

## 2014-04-25 NOTE — Discharge Instructions (Signed)
Even though your symptoms made cardiac causes of your pain worrisome, all of your heart tests came back without showing worrisome results. That said, your blood pressure and diabetes need to be better controlled. Please address this with your PMD. We are calling to set you up with [facility] for COPD and PMD care, but please visit your current PMD in [place] within the week for follow up of this visit.   Here is some information about GERD, which may have been causing your pain.  Gastroesophageal Reflux Disease, Adult Gastroesophageal reflux disease (GERD) happens when acid from your stomach flows up into the esophagus. When acid comes in contact with the esophagus, the acid causes soreness (inflammation) in the esophagus. Over time, GERD may create small holes (ulcers) in the lining of the esophagus. CAUSES   Increased body weight. This puts pressure on the stomach, making acid rise from the stomach into the esophagus.  Smoking. This increases acid production in the stomach.  Drinking alcohol. This causes decreased pressure in the lower esophageal sphincter (valve or ring of muscle between the esophagus and stomach), allowing acid from the stomach into the esophagus.  Late evening meals and a full stomach. This increases pressure and acid production in the stomach.  A malformed lower esophageal sphincter. Sometimes, no cause is found. SYMPTOMS   Burning pain in the lower part of the mid-chest behind the breastbone and in the mid-stomach area. This may occur twice a week or more often.  Trouble swallowing.  Sore throat.  Dry cough.  Asthma-like symptoms including chest tightness, shortness of breath, or wheezing. DIAGNOSIS  Your caregiver may be able to diagnose GERD based on your symptoms. In some cases, X-rays and other tests may be done to check for complications or to check the condition of your stomach and esophagus. TREATMENT  Your caregiver may recommend over-the-counter or  prescription medicines to help decrease acid production. Ask your caregiver before starting or adding any new medicines.  HOME CARE INSTRUCTIONS   Change the factors that you can control. Ask your caregiver for guidance concerning weight loss, quitting smoking, and alcohol consumption.  Avoid foods and drinks that make your symptoms worse, such as:  Caffeine or alcoholic drinks.  Chocolate.  Peppermint or mint flavorings.  Garlic and onions.  Spicy foods.  Citrus fruits, such as oranges, lemons, or limes.  Tomato-based foods such as sauce, chili, salsa, and pizza.  Fried and fatty foods.  Avoid lying down for the 3 hours prior to your bedtime or prior to taking a nap.  Eat small, frequent meals instead of large meals.  Wear loose-fitting clothing. Do not wear anything tight around your waist that causes pressure on your stomach.  Raise the head of your bed 6 to 8 inches with wood blocks to help you sleep. Extra pillows will not help.  Only take over-the-counter or prescription medicines for pain, discomfort, or fever as directed by your caregiver.  Do not take aspirin, ibuprofen, or other nonsteroidal anti-inflammatory drugs (NSAIDs). SEEK IMMEDIATE MEDICAL CARE IF:   You have pain in your arms, neck, jaw, teeth, or back.  Your pain increases or changes in intensity or duration.  You develop nausea, vomiting, or sweating (diaphoresis).  You develop shortness of breath, or you faint.  Your vomit is green, yellow, black, or looks like coffee grounds or blood.  Your stool is red, bloody, or black. These symptoms could be signs of other problems, such as heart disease, gastric bleeding, or esophageal bleeding.  MAKE SURE YOU:   Understand these instructions.  Will watch your condition.  Will get help right away if you are not doing well or get worse. Document Released: 07/19/2005 Document Revised: 01/01/2012 Document Reviewed: 04/28/2011 [facility] Patient  Information 2015 [place], [place]. This information is not intended to replace advice given to you by your health care provider. Make sure you discuss any questions you have with your health care provider.

## 2014-04-25 NOTE — ED Notes (Signed)
Report given to Tori, RN.

## 2014-06-04 ENCOUNTER — Other Ambulatory Visit: Payer: Self-pay | Admitting: *Deleted

## 2014-06-04 NOTE — Telephone Encounter (Signed)
Not an [facility] pt

## 2016-06-02 ENCOUNTER — Other Ambulatory Visit: Payer: Self-pay | Admitting: Internal Medicine

## 2016-06-02 DIAGNOSIS — Z1231 Encounter for screening mammogram for malignant neoplasm of breast: Secondary | ICD-10-CM

## 2016-06-15 ENCOUNTER — Inpatient Hospital Stay: Admission: RE | Admit: 2016-06-15 | Payer: Medicare HMO | Source: Ambulatory Visit
